# Patient Record
Sex: Female | Born: 1975 | Race: Black or African American | Hispanic: No | Marital: Single | Smoking: Never smoker
Health system: Southern US, Community
[De-identification: ages and names within clinical notes are randomized; demographics above are authoritative.]

## PROBLEM LIST (undated history)

## (undated) DIAGNOSIS — Z973 Presence of spectacles and contact lenses: Secondary | ICD-10-CM

## (undated) DIAGNOSIS — E669 Obesity, unspecified: Secondary | ICD-10-CM

## (undated) DIAGNOSIS — L309 Dermatitis, unspecified: Secondary | ICD-10-CM

## (undated) HISTORY — PX: ENDOMETRIAL ABLATION: SHX621

## (undated) HISTORY — PX: BREAST BIOPSY: SHX20

## (undated) HISTORY — PX: TUBAL LIGATION: SHX77

---

## 1998-04-09 DIAGNOSIS — A630 Anogenital (venereal) warts: Secondary | ICD-10-CM

## 1998-04-09 HISTORY — DX: Anogenital (venereal) warts: A63.0

## 2009-12-30 DIAGNOSIS — M2241 Chondromalacia patellae, right knee: Secondary | ICD-10-CM

## 2009-12-30 DIAGNOSIS — M94269 Chondromalacia, unspecified knee: Secondary | ICD-10-CM

## 2009-12-30 DIAGNOSIS — G5601 Carpal tunnel syndrome, right upper limb: Secondary | ICD-10-CM

## 2009-12-30 HISTORY — DX: Carpal tunnel syndrome, right upper limb: G56.01

## 2009-12-30 HISTORY — DX: Chondromalacia, unspecified knee: M94.269

## 2009-12-30 HISTORY — DX: Chondromalacia patellae, right knee: M22.41

## 2014-10-08 DIAGNOSIS — G43909 Migraine, unspecified, not intractable, without status migrainosus: Secondary | ICD-10-CM

## 2014-10-08 HISTORY — DX: Migraine, unspecified, not intractable, without status migrainosus: G43.909

## 2017-04-10 DIAGNOSIS — E7800 Pure hypercholesterolemia, unspecified: Secondary | ICD-10-CM

## 2017-04-10 HISTORY — DX: Pure hypercholesterolemia, unspecified: E78.00

## 2018-02-05 DIAGNOSIS — G609 Hereditary and idiopathic neuropathy, unspecified: Secondary | ICD-10-CM | POA: Diagnosis not present

## 2018-02-05 DIAGNOSIS — G43009 Migraine without aura, not intractable, without status migrainosus: Secondary | ICD-10-CM | POA: Diagnosis not present

## 2018-02-05 DIAGNOSIS — E7800 Pure hypercholesterolemia, unspecified: Secondary | ICD-10-CM | POA: Diagnosis not present

## 2018-02-05 DIAGNOSIS — R6889 Other general symptoms and signs: Secondary | ICD-10-CM | POA: Diagnosis not present

## 2018-02-05 DIAGNOSIS — R202 Paresthesia of skin: Secondary | ICD-10-CM

## 2018-02-05 HISTORY — DX: Paresthesia of skin: R20.2

## 2018-02-21 HISTORY — PX: BREAST BIOPSY: SHX20

## 2018-06-21 DIAGNOSIS — S29012A Strain of muscle and tendon of back wall of thorax, initial encounter: Secondary | ICD-10-CM | POA: Diagnosis not present

## 2018-07-01 DIAGNOSIS — H10022 Other mucopurulent conjunctivitis, left eye: Secondary | ICD-10-CM | POA: Diagnosis not present

## 2018-09-19 DIAGNOSIS — R309 Painful micturition, unspecified: Secondary | ICD-10-CM | POA: Diagnosis not present

## 2018-11-12 DIAGNOSIS — N390 Urinary tract infection, site not specified: Secondary | ICD-10-CM | POA: Diagnosis not present

## 2018-12-17 DIAGNOSIS — Z1231 Encounter for screening mammogram for malignant neoplasm of breast: Secondary | ICD-10-CM | POA: Diagnosis not present

## 2018-12-17 DIAGNOSIS — Z01419 Encounter for gynecological examination (general) (routine) without abnormal findings: Secondary | ICD-10-CM | POA: Diagnosis not present

## 2019-01-20 DIAGNOSIS — Z20828 Contact with and (suspected) exposure to other viral communicable diseases: Secondary | ICD-10-CM | POA: Diagnosis not present

## 2019-02-27 DIAGNOSIS — N760 Acute vaginitis: Secondary | ICD-10-CM | POA: Diagnosis not present

## 2019-02-27 DIAGNOSIS — B9689 Other specified bacterial agents as the cause of diseases classified elsewhere: Secondary | ICD-10-CM | POA: Diagnosis not present

## 2019-02-27 DIAGNOSIS — R102 Pelvic and perineal pain: Secondary | ICD-10-CM | POA: Diagnosis not present

## 2019-02-27 DIAGNOSIS — N730 Acute parametritis and pelvic cellulitis: Secondary | ICD-10-CM | POA: Diagnosis not present

## 2019-02-27 DIAGNOSIS — N739 Female pelvic inflammatory disease, unspecified: Secondary | ICD-10-CM | POA: Diagnosis not present

## 2019-02-27 DIAGNOSIS — N939 Abnormal uterine and vaginal bleeding, unspecified: Secondary | ICD-10-CM | POA: Diagnosis not present

## 2019-04-29 DIAGNOSIS — Z20828 Contact with and (suspected) exposure to other viral communicable diseases: Secondary | ICD-10-CM | POA: Diagnosis not present

## 2019-04-29 DIAGNOSIS — Z03818 Encounter for observation for suspected exposure to other biological agents ruled out: Secondary | ICD-10-CM | POA: Diagnosis not present

## 2019-06-02 DIAGNOSIS — R519 Headache, unspecified: Secondary | ICD-10-CM | POA: Diagnosis not present

## 2019-06-24 DIAGNOSIS — Z0000 Encounter for general adult medical examination without abnormal findings: Secondary | ICD-10-CM | POA: Diagnosis not present

## 2019-06-24 DIAGNOSIS — E7800 Pure hypercholesterolemia, unspecified: Secondary | ICD-10-CM | POA: Diagnosis not present

## 2019-06-24 DIAGNOSIS — E559 Vitamin D deficiency, unspecified: Secondary | ICD-10-CM | POA: Diagnosis not present

## 2019-06-24 DIAGNOSIS — E669 Obesity, unspecified: Secondary | ICD-10-CM | POA: Diagnosis not present

## 2019-06-24 DIAGNOSIS — G43009 Migraine without aura, not intractable, without status migrainosus: Secondary | ICD-10-CM | POA: Diagnosis not present

## 2019-08-07 DIAGNOSIS — Z03818 Encounter for observation for suspected exposure to other biological agents ruled out: Secondary | ICD-10-CM | POA: Diagnosis not present

## 2019-08-07 DIAGNOSIS — Z20822 Contact with and (suspected) exposure to covid-19: Secondary | ICD-10-CM | POA: Diagnosis not present

## 2019-10-11 DIAGNOSIS — Z03818 Encounter for observation for suspected exposure to other biological agents ruled out: Secondary | ICD-10-CM | POA: Diagnosis not present

## 2019-10-11 DIAGNOSIS — Z20822 Contact with and (suspected) exposure to covid-19: Secondary | ICD-10-CM | POA: Diagnosis not present

## 2019-11-01 DIAGNOSIS — R300 Dysuria: Secondary | ICD-10-CM | POA: Diagnosis not present

## 2019-11-01 DIAGNOSIS — R102 Pelvic and perineal pain: Secondary | ICD-10-CM | POA: Diagnosis not present

## 2019-11-15 DIAGNOSIS — R102 Pelvic and perineal pain: Secondary | ICD-10-CM | POA: Diagnosis not present

## 2019-11-15 DIAGNOSIS — R935 Abnormal findings on diagnostic imaging of other abdominal regions, including retroperitoneum: Secondary | ICD-10-CM | POA: Diagnosis not present

## 2019-11-15 DIAGNOSIS — R1031 Right lower quadrant pain: Secondary | ICD-10-CM | POA: Diagnosis not present

## 2019-11-15 DIAGNOSIS — N760 Acute vaginitis: Secondary | ICD-10-CM | POA: Diagnosis not present

## 2019-11-15 DIAGNOSIS — R110 Nausea: Secondary | ICD-10-CM | POA: Diagnosis not present

## 2019-11-15 DIAGNOSIS — B9689 Other specified bacterial agents as the cause of diseases classified elsewhere: Secondary | ICD-10-CM | POA: Diagnosis not present

## 2019-11-15 HISTORY — DX: Right lower quadrant pain: R10.31

## 2019-12-03 DIAGNOSIS — Z20822 Contact with and (suspected) exposure to covid-19: Secondary | ICD-10-CM | POA: Diagnosis not present

## 2019-12-03 DIAGNOSIS — Z03818 Encounter for observation for suspected exposure to other biological agents ruled out: Secondary | ICD-10-CM | POA: Diagnosis not present

## 2019-12-09 DIAGNOSIS — Z09 Encounter for follow-up examination after completed treatment for conditions other than malignant neoplasm: Secondary | ICD-10-CM | POA: Diagnosis not present

## 2019-12-09 DIAGNOSIS — R1030 Lower abdominal pain, unspecified: Secondary | ICD-10-CM | POA: Diagnosis not present

## 2019-12-09 DIAGNOSIS — Z23 Encounter for immunization: Secondary | ICD-10-CM | POA: Diagnosis not present

## 2020-01-02 DIAGNOSIS — R102 Pelvic and perineal pain: Secondary | ICD-10-CM | POA: Diagnosis not present

## 2020-01-02 DIAGNOSIS — N898 Other specified noninflammatory disorders of vagina: Secondary | ICD-10-CM | POA: Diagnosis not present

## 2020-01-02 DIAGNOSIS — Z01419 Encounter for gynecological examination (general) (routine) without abnormal findings: Secondary | ICD-10-CM | POA: Diagnosis not present

## 2020-02-04 DIAGNOSIS — R102 Pelvic and perineal pain: Secondary | ICD-10-CM | POA: Diagnosis not present

## 2020-02-04 DIAGNOSIS — D259 Leiomyoma of uterus, unspecified: Secondary | ICD-10-CM | POA: Diagnosis not present

## 2020-03-02 DIAGNOSIS — Z03818 Encounter for observation for suspected exposure to other biological agents ruled out: Secondary | ICD-10-CM | POA: Diagnosis not present

## 2020-03-02 DIAGNOSIS — Z20822 Contact with and (suspected) exposure to covid-19: Secondary | ICD-10-CM | POA: Diagnosis not present

## 2020-04-23 DIAGNOSIS — R350 Frequency of micturition: Secondary | ICD-10-CM | POA: Diagnosis not present

## 2020-04-23 DIAGNOSIS — R3915 Urgency of urination: Secondary | ICD-10-CM | POA: Diagnosis not present

## 2020-04-23 DIAGNOSIS — N898 Other specified noninflammatory disorders of vagina: Secondary | ICD-10-CM | POA: Diagnosis not present

## 2020-04-23 DIAGNOSIS — R300 Dysuria: Secondary | ICD-10-CM | POA: Diagnosis not present

## 2020-07-16 DIAGNOSIS — R102 Pelvic and perineal pain: Secondary | ICD-10-CM | POA: Diagnosis not present

## 2020-07-16 DIAGNOSIS — K5900 Constipation, unspecified: Secondary | ICD-10-CM | POA: Diagnosis not present

## 2020-07-16 DIAGNOSIS — D259 Leiomyoma of uterus, unspecified: Secondary | ICD-10-CM | POA: Diagnosis not present

## 2020-07-16 DIAGNOSIS — R110 Nausea: Secondary | ICD-10-CM | POA: Diagnosis not present

## 2020-12-05 DIAGNOSIS — R079 Chest pain, unspecified: Secondary | ICD-10-CM | POA: Diagnosis not present

## 2020-12-05 DIAGNOSIS — F17210 Nicotine dependence, cigarettes, uncomplicated: Secondary | ICD-10-CM | POA: Diagnosis not present

## 2020-12-05 DIAGNOSIS — R0789 Other chest pain: Secondary | ICD-10-CM | POA: Diagnosis not present

## 2020-12-05 DIAGNOSIS — R0602 Shortness of breath: Secondary | ICD-10-CM | POA: Diagnosis not present

## 2020-12-05 HISTORY — DX: Chest pain, unspecified: R07.9

## 2020-12-29 DIAGNOSIS — E669 Obesity, unspecified: Secondary | ICD-10-CM | POA: Diagnosis not present

## 2020-12-29 DIAGNOSIS — Z1159 Encounter for screening for other viral diseases: Secondary | ICD-10-CM | POA: Diagnosis not present

## 2020-12-29 DIAGNOSIS — Z6836 Body mass index (BMI) 36.0-36.9, adult: Secondary | ICD-10-CM | POA: Diagnosis not present

## 2020-12-29 DIAGNOSIS — G43009 Migraine without aura, not intractable, without status migrainosus: Secondary | ICD-10-CM | POA: Diagnosis not present

## 2020-12-29 DIAGNOSIS — Z0000 Encounter for general adult medical examination without abnormal findings: Secondary | ICD-10-CM | POA: Diagnosis not present

## 2020-12-29 DIAGNOSIS — Z23 Encounter for immunization: Secondary | ICD-10-CM | POA: Diagnosis not present

## 2021-02-16 DIAGNOSIS — D219 Benign neoplasm of connective and other soft tissue, unspecified: Secondary | ICD-10-CM | POA: Diagnosis not present

## 2021-02-16 DIAGNOSIS — N946 Dysmenorrhea, unspecified: Secondary | ICD-10-CM | POA: Diagnosis not present

## 2021-02-16 DIAGNOSIS — Z01419 Encounter for gynecological examination (general) (routine) without abnormal findings: Secondary | ICD-10-CM | POA: Diagnosis not present

## 2021-03-24 ENCOUNTER — Other Ambulatory Visit: Payer: Self-pay | Admitting: Obstetrics and Gynecology

## 2021-03-24 DIAGNOSIS — Z1231 Encounter for screening mammogram for malignant neoplasm of breast: Secondary | ICD-10-CM

## 2021-05-03 DIAGNOSIS — R809 Proteinuria, unspecified: Secondary | ICD-10-CM | POA: Diagnosis not present

## 2021-05-03 DIAGNOSIS — R1030 Lower abdominal pain, unspecified: Secondary | ICD-10-CM | POA: Diagnosis not present

## 2021-05-03 DIAGNOSIS — R5383 Other fatigue: Secondary | ICD-10-CM | POA: Diagnosis not present

## 2021-05-03 DIAGNOSIS — R3589 Other polyuria: Secondary | ICD-10-CM | POA: Diagnosis not present

## 2021-05-03 DIAGNOSIS — J069 Acute upper respiratory infection, unspecified: Secondary | ICD-10-CM | POA: Diagnosis not present

## 2021-05-08 DIAGNOSIS — R309 Painful micturition, unspecified: Secondary | ICD-10-CM | POA: Diagnosis not present

## 2021-05-13 DIAGNOSIS — E559 Vitamin D deficiency, unspecified: Secondary | ICD-10-CM

## 2021-05-13 HISTORY — DX: Vitamin D deficiency, unspecified: E55.9

## 2021-05-22 DIAGNOSIS — U071 COVID-19: Secondary | ICD-10-CM

## 2021-05-22 HISTORY — DX: COVID-19: U07.1

## 2021-06-14 ENCOUNTER — Ambulatory Visit: Payer: Self-pay

## 2021-06-14 DIAGNOSIS — D219 Benign neoplasm of connective and other soft tissue, unspecified: Secondary | ICD-10-CM | POA: Diagnosis not present

## 2021-06-14 DIAGNOSIS — N946 Dysmenorrhea, unspecified: Secondary | ICD-10-CM | POA: Diagnosis not present

## 2021-06-18 DIAGNOSIS — N390 Urinary tract infection, site not specified: Secondary | ICD-10-CM

## 2021-06-18 DIAGNOSIS — N898 Other specified noninflammatory disorders of vagina: Secondary | ICD-10-CM | POA: Diagnosis not present

## 2021-06-18 DIAGNOSIS — N3000 Acute cystitis without hematuria: Secondary | ICD-10-CM | POA: Diagnosis not present

## 2021-06-18 DIAGNOSIS — N760 Acute vaginitis: Secondary | ICD-10-CM | POA: Diagnosis not present

## 2021-06-18 DIAGNOSIS — R300 Dysuria: Secondary | ICD-10-CM | POA: Diagnosis not present

## 2021-06-18 DIAGNOSIS — B9689 Other specified bacterial agents as the cause of diseases classified elsewhere: Secondary | ICD-10-CM | POA: Diagnosis not present

## 2021-06-18 HISTORY — DX: Urinary tract infection, site not specified: N39.0

## 2021-06-21 DIAGNOSIS — E876 Hypokalemia: Secondary | ICD-10-CM | POA: Diagnosis not present

## 2021-06-21 DIAGNOSIS — Z79899 Other long term (current) drug therapy: Secondary | ICD-10-CM | POA: Diagnosis not present

## 2021-06-21 DIAGNOSIS — R110 Nausea: Secondary | ICD-10-CM | POA: Diagnosis not present

## 2021-06-21 DIAGNOSIS — R1011 Right upper quadrant pain: Secondary | ICD-10-CM | POA: Diagnosis not present

## 2021-06-21 DIAGNOSIS — D259 Leiomyoma of uterus, unspecified: Secondary | ICD-10-CM

## 2021-06-21 DIAGNOSIS — R109 Unspecified abdominal pain: Secondary | ICD-10-CM | POA: Diagnosis not present

## 2021-06-21 DIAGNOSIS — Z90710 Acquired absence of both cervix and uterus: Secondary | ICD-10-CM | POA: Diagnosis not present

## 2021-06-21 DIAGNOSIS — R1084 Generalized abdominal pain: Secondary | ICD-10-CM | POA: Diagnosis not present

## 2021-06-21 HISTORY — DX: Leiomyoma of uterus, unspecified: D25.9

## 2021-06-23 ENCOUNTER — Encounter (HOSPITAL_BASED_OUTPATIENT_CLINIC_OR_DEPARTMENT_OTHER): Payer: Self-pay | Admitting: Obstetrics and Gynecology

## 2021-06-23 ENCOUNTER — Other Ambulatory Visit: Payer: Self-pay

## 2021-06-23 DIAGNOSIS — Z01812 Encounter for preprocedural laboratory examination: Secondary | ICD-10-CM | POA: Diagnosis not present

## 2021-06-23 NOTE — Progress Notes (Signed)
? ? Your procedure is scheduled on Wednesday, 06/30/21 ? Report to [facility] ? Call this number if you have problems the morning of surgery  [phone]. ? ? [facility] [place].  WE ARE LOCATED IN THE [facility]. ? ?PLEASE BRING YOUR INSURANCE CARD AND PHOTO ID DAY OF SURGERY. ? ?ONLY 2 PEOPLE ARE ALLOWED IN  WAITING  ROOM.  ?                                   ? REMEMBER: ? DO NOT EAT FOOD, CANDY GUM OR MINTS  AFTER MIDNIGHT THE NIGHT BEFORE YOUR SURGERY . YOU MAY HAVE CLEAR LIQUIDS FROM MIDNIGHT THE NIGHT BEFORE YOUR SURGERY UNTIL  5:30 am. NO CLEAR LIQUIDS AFTER   5:30 am DAY OF SURGERY. ? ?YOU MAY  BRUSH YOUR TEETH MORNING OF SURGERY AND RINSE YOUR MOUTH OUT, NO CHEWING GUM CANDY OR MINTS. ? ? ? ? ?CLEAR LIQUID DIET ? ? ?Foods Allowed                                                                     Foods Excluded ? ?Coffee and tea, regular and decaf                             liquids that you cannot  ?Plain Jell-O                                                                   see through such as: ?Fruit ices (not with fruit pulp)                                     milk, soups, orange juice  ?Plain  Popsicles                                    All solid food ?Carbonated beverages, regular and diet                                    ?Cranberry, grape and apple juices ?Sports drinks like Gatorade ?_____________________________________________________________________ ?  ? ? TAKE THESE MEDICATIONS MORNING OF SURGERY:  Macrobid (only if you can tolerate without food), Zyrtec if needed, Flexeril if needed, Roxicodone if needed, Imitrex if needed ? ? UP TO 4 VISITORS  MAY VISIT IN THE EXTENDED RECOVERY ROOM UNTIL 800 PM ONLY.  1 VISITOR AGE 66 AND OVER MAY SPEND THE NIGHT AND MUST BE IN EXTENDED RECOVERY ROOM NO LATER THAN 800 PM . YOUR DISCHARGE TIME AFTER YOU SPEND THE NIGHT IS 900 AM THE MORNING AFTER  YOUR SURGERY. ? ?YOU MAY PACK A SMALL  OVERNIGHT BAG WITH TOILETRIES FOR YOUR OVERNIGHT STAY IF YOU WISH. ? ?YOUR PRESCRIPTION MEDICATIONS WILL BE PROVIDED DURING Spring. ? ? ?                                   ?DO NOT WEAR JEWERLY, MAKE UP. ?DO NOT WEAR LOTIONS, POWDERS, PERFUMES OR NAIL POLISH ON YOUR FINGERNAILS. TOENAIL POLISH IS OK TO WEAR. ?DO NOT SHAVE FOR 48 HOURS PRIOR TO DAY OF SURGERY. ?MEN MAY SHAVE FACE AND NECK. ?CONTACTS, GLASSES, OR DENTURES MAY NOT BE WORN TO SURGERY. ? ?REMEMBER: NO SMOKING, DRUGS OR ALCOHOL FOR 24 HOURS BEFORE YOUR SURGERY. ?                                   ?[facility] IS NOT RESPONSIBLE  FOR ANY BELONGINGS.                                  ?                                  . ?          [facility] - Preparing for Surgery ?Before surgery, you can play an important role.  Because skin is not sterile, your skin needs to be as free of germs as possible.  You can reduce the number of germs on your skin by washing with CHG (chlorahexidine gluconate) soap before surgery.  CHG is an antiseptic cleaner which kills germs and bonds with the skin to continue killing germs even after washing. ?Please DO NOT use if you have an allergy to CHG or antibacterial soaps.  If your skin becomes reddened/irritated stop using the CHG and inform your nurse when you arrive at Short Stay. ?Do not shave (including legs and underarms) for at least 48 hours prior to the first CHG shower.  You may shave your face/neck. ?Please follow these instructions carefully: ? 1.  Shower with CHG Soap the night before surgery and the  morning of Surgery. ? 2.  If you choose to wash your hair, wash your hair first as usual with your  normal  shampoo. ? 3.  After you shampoo, rinse your hair and body thoroughly to remove the  shampoo.                            ?4.  Use CHG as you would any other liquid soap.  You can apply chg directly  to the skin and wash , please wash your belly button thoroughly with chg soap provided night before and  morning of your surgery. ?                    Gently with a scrungie or clean washcloth. ? 5.  Apply the CHG Soap to your body ONLY FROM THE NECK DOWN.   Do not use on face/ open      ?                     Wound or open sores. Avoid contact with eyes, ears mouth  and genitals (private parts).  ?                     Production manager,  Genitals (private parts) with your normal soap. ?            6.  Wash thoroughly, paying special attention to the area where your surgery  will be performed. ? 7.  Thoroughly rinse your body with warm water from the neck down. ? 8.  DO NOT shower/wash with your normal soap after using and rinsing off  the CHG Soap. ?               9.  Pat yourself dry with a clean towel. ?           10.  Wear clean pajamas. ?           11.  Place clean sheets on your bed the night of your first shower and do not  sleep with pets. ?Day of Surgery : ?Do not apply any lotions/deodorants the morning of surgery.  Please wear clean clothes to the hospital/surgery center. ? ?IF YOU HAVE ANY SKIN IRRITATION OR PROBLEMS WITH THE SURGICAL SOAP, PLEASE GET A BAR OF GOLD DIAL SOAP AND SHOWER THE NIGHT BEFORE YOUR SURGERY AND THE MORNING OF YOUR SURGERY. PLEASE LET THE NURSE KNOW MORNING OF YOUR SURGERY IF YOU HAD ANY PROBLEMS WITH THE SURGICAL SOAP. ? ? ?________________________________________________________________________                  ?                                    ?  QUESTIONS CALL Camrin [facility] PRE OP NURSE PHONE [phone].                                    ?

## 2021-06-23 NOTE — Progress Notes (Signed)
Spoke w/ via phone for pre-op interview---Eriona ?Lab needs dos---- urine pregnancy POCT per anesthesia, surgeon orders pending as of 06/23/21              ?Lab results------06/28/21 lab appt for CBC, type & screen, 06/21/21 multiple labs including CBC in Care Everywhere ?COVID test -----patient states asymptomatic no test needed ?Arrive at -------0630 on Wednesday, 06/30/21 ?NPO after MN NO Solid Food.  Clear liquids from MN until---0530 ?Med rec completed ?Medications to take morning of surgery -----Zyrtec prn, Flexeril prn, Macrobid, Roxicodone prn, Imitrex prn ?Diabetic medication -----n/a ?Patient instructed no nail polish to be worn day of surgery ?Patient instructed to bring photo id and insurance card day of surgery ?Patient aware to have Driver (ride ) / caregiver    for 24 hours after surgery - daughter, Debby Freiberg ?Patient Special Instructions -----Extended recovery / overnight instructions given. ?Pre-Op special Istructions -----Requested orders via Epic IB from Dr. Landry Mellow on 06/23/21. ?Patient verbalized understanding of instructions that were given at this phone interview. ?Patient denies shortness of breath, chest pain, fever, cough at this phone interview.  ?

## 2021-06-28 ENCOUNTER — Encounter (HOSPITAL_COMMUNITY)
Admission: RE | Admit: 2021-06-28 | Discharge: 2021-06-28 | Disposition: A | Discharge status: Home / Self Care | Payer: BC Managed Care – PPO | Source: Ambulatory Visit | Attending: Obstetrics and Gynecology | Admitting: Obstetrics and Gynecology

## 2021-06-28 DIAGNOSIS — Z01812 Encounter for preprocedural laboratory examination: Secondary | ICD-10-CM | POA: Insufficient documentation

## 2021-06-28 DIAGNOSIS — Z01818 Encounter for other preprocedural examination: Secondary | ICD-10-CM

## 2021-06-28 LAB — CBC
HCT: 43.4 % (ref 36.0–46.0)
Hemoglobin: 14.5 g/dL (ref 12.0–15.0)
MCH: 29.7 pg (ref 26.0–34.0)
MCHC: 33.4 g/dL (ref 30.0–36.0)
MCV: 88.8 fL (ref 80.0–100.0)
Platelets: 245 10*3/uL (ref 150–400)
RBC: 4.89 MIL/uL (ref 3.87–5.11)
RDW: 12.5 % (ref 11.5–15.5)
WBC: 5.7 10*3/uL (ref 4.0–10.5)
nRBC: 0 % (ref 0.0–0.2)

## 2021-06-29 ENCOUNTER — Other Ambulatory Visit: Payer: Self-pay | Admitting: Obstetrics and Gynecology

## 2021-06-29 DIAGNOSIS — D251 Intramural leiomyoma of uterus: Secondary | ICD-10-CM

## 2021-06-29 NOTE — H&P (Signed)
? ?Reason for Appointment  ? ?1. Preop  ?  ?  ? ?History of Present Illness  ?General:   ?       46 y/o presents for preop visit. Pt is schedule for a robotic-assisted laparoscopic hysterectomy with bilateral salpingectomy on 06/30/2021 at 8:30 for the management for dysmenorrhea and fibroids. ?       U/S performed Feb 04, 2020, revealed uterus measuring 9.4 x 6.3 x 7.1cm. 2 fibroids were measured w/ the largest measuring 4.5 cm and located posterior. 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 Bilat OV WNL.  ?  ? ?Current Medications  ?Taking  ?Flexeril 10 mg 30 10 mg Tablets one tablet orally at bedtime as needed ?  ? ?Ibuprofen 800 MG Tablet 1 tablet Orally every 8 hrs as needed ?  ? ?Magnesium 300 MG Capsule 1 capsule with a meal Orally Once a day, Notes: prn ?  ? ?Vitamin D 50000 U Tablet as directed Orally  ?  ? ?Medication List reviewed and reconciled with the patient  ?  ?  ?  ? ?Past Medical History  ? ?     Medical History Verified. ?  ?  ?  ? ?Surgical History  ? ?      tubal ligation   ? ?      Novasure ablation   ?  ?  ? ?Family History  ? ?Father: deceased, lung cancer  ? ?Mother: alive, hyperlipidemia, diagnosed with Hypertension  ? ?denies any GYN family cancer hx.  ?  ?  ? ?Social History  ?General:   ?Tobacco use  cigarettes: Never smoked, Tobacco history last updated 06/14/2021, Vaping No. Alcohol: yes, social. Caffeine: yes, 1 serving daily. no Recreational drug use, marijuanna , in past. Marital Status: single. Children: girls, 2.   ?  ? ?Gyn History  ?Sexual activity not currently sexually active.   ?Periods : irregular.   ?LMP 12/2019.   ?Birth control BTL.   ?Last pap smear date 01/02/2020 - normal/HPV neg.   ?Last mammogram date 2020 per pt- normal.   ?H/O Abnormal pap smear yes, assessed with colposcopy, treated with LEEP.   ?H/O STD Ada.    ?  ? ?OB History  ?Number of pregnancies 3.   ?Pregnancy # 1 vaginal delivery.   ?Pregnancy # 2  vaginal delivery.   ?Pregnancy # 3 abortion.    ?  ? ?Allergies  ? ?Ultram: rash  ?  ?  ? ?Hospitalization/Major Diagnostic Procedure  ? ?chilbirth x 2   ?  ?  ? ?Review of Systems  ?CONSTITUTIONAL:   ?      Chills No. Fatigue No. Fever No. Night sweats No. Recent travel outside [place] No. Sweats No. Weight change No.     ?OPHTHALMOLOGY:   ?      Blurring of vision no. Change in vision no. Double vision no.     ?ENT:   ?      Dizziness no. Nose bleeds no. Sore throat no. Teeth pain no.     ?ALLERGY:   ?      Hives no.     ?CARDIOLOGY:   ?      Chest pain no. High blood pressure no. Irregular heart beat no. Leg edema no. Palpitations no.     ?RESPIRATORY:   ?      Shortness of breath no. Cough no. Wheezing no.     ?UROLOGY:   ?  Pain with urination no. Urinary urgency no. Urinary frequency no. Urinary incontinence no. Difficulty urinating No. Blood in urine No.     ?GASTROENTEROLOGY:   ?      Abdominal pain no. Appetite change no. Bloating/belching no. Blood in stool or on toilet paper no. Change in bowel movements no. Constipation no. Diarrhea no. Difficulty swallowing no. Nausea no.     ?FEMALE REPRODUCTIVE:   ?      Vulvar pain no. Vulvar rash no. Abnormal vaginal bleeding no. Breast pain no. Nipple discharge no. Pain with intercourse no. Pelvic pain yes. Unusual vaginal discharge no. Vaginal itching no.     ?MUSCULOSKELETAL:   ?      Muscle aches no.     ?NEUROLOGY:   ?      Headache no. Tingling/numbness no. Weakness no.     ?PSYCHOLOGY:   ?      Depression no. Anxiety no. Nervousness no. Sleep disturbances no. Suicidal ideation no .     ?ENDOCRINOLOGY:   ?      Excessive thirst no. Excessive urination no. Hair loss no. Heat or cold intolerance no.     ?HEMATOLOGY/LYMPH:   ?      Abnormal bleeding no. Easy bruising no. Swollen glands no.     ?DERMATOLOGY:   ?      New/changing skin lesion no. Rash no. Sores no.      ?  ? ? ?Vital Signs  ?Wt 203.2, Wt change -15.6 lbs, Ht 65, BMI 33.81, Pulse sitting 94, BP  sitting 133/90.  ?  ? ?Examination  ?General Examination: ?      CONSTITUTIONAL: alert, oriented, NAD . SKIN:  moist, warm. EYES:  Conjunctiva clear. LUNGS:  good I:E efffort noted, clear to auscultation bilaterally. HEART:  regular rate and rhythm. ABDOMEN: soft, non-tender/non-distended, bowel sounds present . FEMALE GENITOURINARY: normal external genitalia, labia - unremarkable, vagina - pink moist mucosa, no lesions or abnormal discharge, cervix - no discharge or lesions or CMT, adnexa - no masses or tenderness, uterus - nontender and normal size on palpation . EXTREMITIES:  no edema present. PSYCH:  affect normal, good eye contact.  ?  ?  ? ?Physical Examination  ?Chaperone present:   ?      Chaperone present  Beather Arbour 06/14/2021 12:26:42 PM > , for pelvic exam.   ?      ?  ? ?Assessments  ?  ? ?1. Fibroids - D21.9 (Primary)  ? ?2. Dysmenorrhea - N94.6  ?  ? ?Treatment  ? ?1. Fibroids   ?Notes: patient desires definitive therapy via a hysterectomy.. planning robotic assisted laparoscopic hysterectomy with bilateral salpingectomy. . I discussed with the pt r/b/a of hysterectomy including but not limited to infection/ bleeding damage to bowel bladder ureters and surrounding organs with the need for further surgery. r/o conversion for laparoscopic hysterectomy to total abdominal hysterectomy discussed. ... r/o transfustion hiv/ hep B&C discussed.. pt voiced understanding and desires to proceed. ? ?she is advised not to eat after midnight the night prior to surgery. she should avoid NSAIDS between now and surgery. .     ? ?2. Dysmenorrhea   ?Notes: patient desires definitive therapy via a hysterectomy.. planning robotic assisted laparoscopic hysterectomy with bilateral salpingectomy. . I discussed with the pt r/b/a of hysterectomy including but not limited to infection/ bleeding damage to bowel bladder ureters and surrounding organs with the need for further surgery. r/o conversion for laparoscopic hysterectomy  to total abdominal hysterectomy discussed. Marland Kitchen..  r/o transfustion hiv/ hep B&C discussed.. pt voiced understanding and desires to proceed..     ?  ?  ? ?

## 2021-06-29 NOTE — Anesthesia Preprocedure Evaluation (Addendum)
Anesthesia Evaluation  ?Patient identified by MRN, date of birth, ID band ?Patient awake ? ? ? ?Reviewed: ?Allergy & Precautions, NPO status , Patient's Chart, lab work & pertinent test results ? ?History of Anesthesia Complications ?Negative for: history of anesthetic complications ? ?Airway ?Mallampati: II ? ?TM Distance: >3 FB ?Neck ROM: Full ? ? ? Dental ?no notable dental hx. ?(+) Dental Advisory Given ?  ?Pulmonary ?neg pulmonary ROS,  ?  ?Pulmonary exam normal ? ? ? ? ? ? ? Cardiovascular ?negative cardio ROS ?Normal cardiovascular exam ? ? ?  ?Neuro/Psych ? Headaches,   ? GI/Hepatic ?negative GI ROS, Neg liver ROS,   ?Endo/Other  ?negative endocrine ROS ? Renal/GU ?negative Renal ROS  ? ?  ?Musculoskeletal ?negative musculoskeletal ROS ?(+)  ? Abdominal ?  ?Peds ? Hematology ?negative hematology ROS ?(+)   ?Anesthesia Other Findings ? ? Reproductive/Obstetrics ? ?  ? ? ? ? ? ? ? ? ? ? ? ? ? ?  ?  ? ? ? ? ? ? ? ?Anesthesia Physical ?Anesthesia Plan ? ?ASA: 2 ? ?Anesthesia Plan: General  ? ?Post-op Pain Management: Celebrex PO (pre-op)* and Tylenol PO (pre-op)*  ? ?Induction:  ? ?PONV Risk Score and Plan: 4 or greater and Ondansetron, Dexamethasone, Midazolam and Scopolamine patch - Pre-op ? ?Airway Management Planned: Oral ETT ? ?Additional Equipment:  ? ?Intra-op Plan:  ? ?Post-operative Plan: Extubation in OR ? ?Informed Consent: I have reviewed the patients History and Physical, chart, labs and discussed the procedure including the risks, benefits and alternatives for the proposed anesthesia with the patient or authorized representative who has indicated his/her understanding and acceptance.  ? ? ? ?Dental advisory given ? ?Plan Discussed with: Anesthesiologist and CRNA ? ?Anesthesia Plan Comments:   ? ? ? ? ? ?Anesthesia Quick Evaluation ? ?

## 2021-06-30 ENCOUNTER — Observation Stay (HOSPITAL_BASED_OUTPATIENT_CLINIC_OR_DEPARTMENT_OTHER): Payer: BC Managed Care – PPO | Admitting: Anesthesiology

## 2021-06-30 ENCOUNTER — Observation Stay (HOSPITAL_BASED_OUTPATIENT_CLINIC_OR_DEPARTMENT_OTHER)
Admission: RE | Admit: 2021-06-30 | Discharge: 2021-06-30 | Disposition: A | Discharge status: Home / Self Care | Payer: BC Managed Care – PPO | Attending: Obstetrics and Gynecology | Admitting: Obstetrics and Gynecology

## 2021-06-30 ENCOUNTER — Other Ambulatory Visit: Payer: Self-pay

## 2021-06-30 ENCOUNTER — Encounter (HOSPITAL_BASED_OUTPATIENT_CLINIC_OR_DEPARTMENT_OTHER)
Admission: RE | Disposition: A | Discharge status: Home / Self Care | Payer: Self-pay | Source: Home / Self Care | Attending: Obstetrics and Gynecology

## 2021-06-30 ENCOUNTER — Encounter (HOSPITAL_BASED_OUTPATIENT_CLINIC_OR_DEPARTMENT_OTHER): Payer: Self-pay | Admitting: Obstetrics and Gynecology

## 2021-06-30 DIAGNOSIS — D251 Intramural leiomyoma of uterus: Principal | ICD-10-CM | POA: Insufficient documentation

## 2021-06-30 DIAGNOSIS — D282 Benign neoplasm of uterine tubes and ligaments: Secondary | ICD-10-CM | POA: Diagnosis not present

## 2021-06-30 DIAGNOSIS — D259 Leiomyoma of uterus, unspecified: Secondary | ICD-10-CM | POA: Diagnosis not present

## 2021-06-30 DIAGNOSIS — Z90710 Acquired absence of both cervix and uterus: Secondary | ICD-10-CM | POA: Diagnosis present

## 2021-06-30 DIAGNOSIS — N8003 Adenomyosis of the uterus: Secondary | ICD-10-CM | POA: Insufficient documentation

## 2021-06-30 DIAGNOSIS — N9089 Other specified noninflammatory disorders of vulva and perineum: Secondary | ICD-10-CM | POA: Diagnosis not present

## 2021-06-30 DIAGNOSIS — L814 Other melanin hyperpigmentation: Secondary | ICD-10-CM | POA: Diagnosis not present

## 2021-06-30 DIAGNOSIS — N946 Dysmenorrhea, unspecified: Secondary | ICD-10-CM | POA: Diagnosis not present

## 2021-06-30 DIAGNOSIS — Z01818 Encounter for other preprocedural examination: Secondary | ICD-10-CM

## 2021-06-30 HISTORY — DX: Obesity, unspecified: E66.9

## 2021-06-30 HISTORY — DX: Dermatitis, unspecified: L30.9

## 2021-06-30 HISTORY — PX: ROBOTIC ASSISTED LAPAROSCOPIC HYSTERECTOMY AND SALPINGECTOMY: SHX6379

## 2021-06-30 HISTORY — DX: Presence of spectacles and contact lenses: Z97.3

## 2021-06-30 LAB — POCT PREGNANCY, URINE: Preg Test, Ur: NEGATIVE

## 2021-06-30 LAB — TYPE AND SCREEN
ABO/RH(D): O POS
Antibody Screen: NEGATIVE

## 2021-06-30 LAB — ABO/RH: ABO/RH(D): O POS

## 2021-06-30 SURGERY — XI ROBOTIC ASSISTED LAPAROSCOPIC HYSTERECTOMY AND SALPINGECTOMY
Anesthesia: General | Laterality: Bilateral

## 2021-06-30 MED ORDER — AMISULPRIDE (ANTIEMETIC) 5 MG/2ML IV SOLN: 10.0000 mg | Freq: Once | INTRAVENOUS | Status: DC | PRN

## 2021-06-30 MED ORDER — ONDANSETRON HCL 4 MG/2ML IJ SOLN
INTRAMUSCULAR | Status: DC | PRN
  Administered 2021-06-30: 4 mg via INTRAVENOUS

## 2021-06-30 MED ORDER — EPHEDRINE SULFATE (PRESSORS) 50 MG/ML IJ SOLN
INTRAMUSCULAR | Status: DC | PRN
  Administered 2021-06-30: 10 mg via INTRAVENOUS

## 2021-06-30 MED ORDER — SCOPOLAMINE 1 MG/3DAYS TD PT72
1.0000 | MEDICATED_PATCH | TRANSDERMAL | Status: DC
  Administered 2021-06-30: 1.5 mg via TRANSDERMAL

## 2021-06-30 MED ORDER — SCOPOLAMINE 1 MG/3DAYS TD PT72
MEDICATED_PATCH | TRANSDERMAL | Status: AC
  Filled 2021-06-30: qty 1

## 2021-06-30 MED ORDER — SODIUM CHLORIDE 0.9 % IV SOLN
2.0000 g | INTRAVENOUS | Status: AC
  Administered 2021-06-30: 2 g via INTRAVENOUS

## 2021-06-30 MED ORDER — SUGAMMADEX SODIUM 200 MG/2ML IV SOLN
INTRAVENOUS | Status: DC | PRN
  Administered 2021-06-30: 200 mg via INTRAVENOUS

## 2021-06-30 MED ORDER — OXYCODONE HCL 5 MG PO TABS
ORAL_TABLET | ORAL | Status: AC
  Filled 2021-06-30: qty 1

## 2021-06-30 MED ORDER — MENTHOL 3 MG MT LOZG: 1.0000 | LOZENGE | OROMUCOSAL | Status: DC | PRN

## 2021-06-30 MED ORDER — FENTANYL CITRATE (PF) 100 MCG/2ML IJ SOLN
INTRAMUSCULAR | Status: DC | PRN
  Administered 2021-06-30: 100 ug via INTRAVENOUS
  Administered 2021-06-30: 50 ug via INTRAVENOUS

## 2021-06-30 MED ORDER — POVIDONE-IODINE 10 % EX SWAB: 2.0000 "application " | Freq: Once | CUTANEOUS | Status: DC

## 2021-06-30 MED ORDER — ACETAMINOPHEN 500 MG PO TABS
1000.0000 mg | ORAL_TABLET | Freq: Four times a day (QID) | ORAL | Status: DC
  Administered 2021-06-30: 1000 mg via ORAL

## 2021-06-30 MED ORDER — IBUPROFEN 600 MG PO TABS: 600.0000 mg | ORAL_TABLET | Freq: Four times a day (QID) | ORAL | 1 refills | Status: AC | PRN

## 2021-06-30 MED ORDER — LACTATED RINGERS IV SOLN: INTRAVENOUS | Status: DC

## 2021-06-30 MED ORDER — IBUPROFEN 200 MG PO TABS: 600.0000 mg | ORAL_TABLET | Freq: Four times a day (QID) | ORAL | Status: DC

## 2021-06-30 MED ORDER — SIMETHICONE 80 MG PO CHEW: 80.0000 mg | CHEWABLE_TABLET | Freq: Four times a day (QID) | ORAL | Status: DC | PRN

## 2021-06-30 MED ORDER — DEXAMETHASONE SODIUM PHOSPHATE 4 MG/ML IJ SOLN
INTRAMUSCULAR | Status: DC | PRN
  Administered 2021-06-30: 8 mg via INTRAVENOUS

## 2021-06-30 MED ORDER — GABAPENTIN 300 MG PO CAPS
300.0000 mg | ORAL_CAPSULE | ORAL | Status: AC
  Administered 2021-06-30: 300 mg via ORAL

## 2021-06-30 MED ORDER — ROCURONIUM BROMIDE 100 MG/10ML IV SOLN
INTRAVENOUS | Status: DC | PRN
  Administered 2021-06-30: 100 mg via INTRAVENOUS

## 2021-06-30 MED ORDER — PHENYLEPHRINE HCL (PRESSORS) 10 MG/ML IV SOLN
INTRAVENOUS | Status: AC
  Filled 2021-06-30: qty 1

## 2021-06-30 MED ORDER — SENNA 8.6 MG PO TABS
ORAL_TABLET | ORAL | Status: AC
  Filled 2021-06-30: qty 1

## 2021-06-30 MED ORDER — SODIUM CHLORIDE 0.9 % IV SOLN
INTRAVENOUS | Status: DC | PRN
  Administered 2021-06-30: 120 mL

## 2021-06-30 MED ORDER — ACETAMINOPHEN 500 MG PO TABS: 1000.0000 mg | ORAL_TABLET | Freq: Three times a day (TID) | ORAL | 0 refills | Status: AC | PRN

## 2021-06-30 MED ORDER — OXYCODONE HCL 5 MG PO TABS: 5.0000 mg | ORAL_TABLET | ORAL | 0 refills | Status: AC | PRN

## 2021-06-30 MED ORDER — KETOROLAC TROMETHAMINE 30 MG/ML IJ SOLN
30.0000 mg | Freq: Once | INTRAMUSCULAR | Status: AC
  Administered 2021-06-30: 30 mg via INTRAVENOUS

## 2021-06-30 MED ORDER — ONDANSETRON HCL 4 MG PO TABS: 4.0000 mg | ORAL_TABLET | Freq: Four times a day (QID) | ORAL | Status: DC | PRN

## 2021-06-30 MED ORDER — OXYCODONE HCL 5 MG PO TABS
ORAL_TABLET | ORAL | Status: AC
  Filled 2021-06-30: qty 2

## 2021-06-30 MED ORDER — ONDANSETRON HCL 4 MG/2ML IJ SOLN: 4.0000 mg | Freq: Four times a day (QID) | INTRAMUSCULAR | Status: DC | PRN

## 2021-06-30 MED ORDER — FENTANYL CITRATE (PF) 100 MCG/2ML IJ SOLN
25.0000 ug | INTRAMUSCULAR | Status: DC | PRN
  Administered 2021-06-30 (×2): 50 ug via INTRAVENOUS

## 2021-06-30 MED ORDER — STERILE WATER FOR IRRIGATION IR SOLN
Status: DC | PRN
  Administered 2021-06-30: 500 mL

## 2021-06-30 MED ORDER — GLYCOPYRROLATE 0.2 MG/ML IJ SOLN
INTRAMUSCULAR | Status: DC | PRN
  Administered 2021-06-30: .1 mg via INTRAVENOUS

## 2021-06-30 MED ORDER — PROPOFOL 10 MG/ML IV BOLUS
INTRAVENOUS | Status: DC | PRN
  Administered 2021-06-30: 200 mg via INTRAVENOUS
  Administered 2021-06-30: 20 mg via INTRAVENOUS

## 2021-06-30 MED ORDER — FENTANYL CITRATE (PF) 100 MCG/2ML IJ SOLN
INTRAMUSCULAR | Status: AC
  Filled 2021-06-30: qty 2

## 2021-06-30 MED ORDER — PANTOPRAZOLE SODIUM 40 MG PO TBEC
DELAYED_RELEASE_TABLET | ORAL | Status: AC
  Filled 2021-06-30: qty 1

## 2021-06-30 MED ORDER — CELECOXIB 200 MG PO CAPS
ORAL_CAPSULE | ORAL | Status: AC
Start: 2021-06-30 — End: ?
  Filled 2021-06-30: qty 1

## 2021-06-30 MED ORDER — SENNA 8.6 MG PO TABS
1.0000 | ORAL_TABLET | Freq: Two times a day (BID) | ORAL | Status: DC
  Administered 2021-06-30: 8.6 mg via ORAL

## 2021-06-30 MED ORDER — OXYCODONE HCL 5 MG PO TABS
5.0000 mg | ORAL_TABLET | ORAL | Status: DC | PRN
  Administered 2021-06-30: 10 mg via ORAL
  Administered 2021-06-30: 5 mg via ORAL

## 2021-06-30 MED ORDER — SODIUM CHLORIDE 0.9 % IR SOLN
Status: DC | PRN
Start: 2021-06-30 — End: 2021-06-30
  Administered 2021-06-30: 1000 mL

## 2021-06-30 MED ORDER — MIDAZOLAM HCL 2 MG/2ML IJ SOLN
INTRAMUSCULAR | Status: DC | PRN
  Administered 2021-06-30: 2 mg via INTRAVENOUS

## 2021-06-30 MED ORDER — ACETAMINOPHEN 500 MG PO TABS
1000.0000 mg | ORAL_TABLET | ORAL | Status: AC
  Administered 2021-06-30: 1000 mg via ORAL

## 2021-06-30 MED ORDER — ALUM & MAG HYDROXIDE-SIMETH 200-200-20 MG/5ML PO SUSP: 30.0000 mL | ORAL | Status: DC | PRN

## 2021-06-30 MED ORDER — GABAPENTIN 300 MG PO CAPS
ORAL_CAPSULE | ORAL | Status: AC
  Filled 2021-06-30: qty 1

## 2021-06-30 MED ORDER — PANTOPRAZOLE SODIUM 40 MG PO TBEC
40.0000 mg | DELAYED_RELEASE_TABLET | Freq: Every day | ORAL | Status: DC
  Administered 2021-06-30: 40 mg via ORAL

## 2021-06-30 MED ORDER — HYDROMORPHONE HCL 1 MG/ML IJ SOLN: 0.2000 mg | INTRAMUSCULAR | Status: DC | PRN

## 2021-06-30 MED ORDER — PHENYLEPHRINE HCL-NACL 20-0.9 MG/250ML-% IV SOLN
INTRAVENOUS | Status: DC | PRN
  Administered 2021-06-30: 30 ug/min via INTRAVENOUS

## 2021-06-30 MED ORDER — FENTANYL CITRATE (PF) 250 MCG/5ML IJ SOLN
INTRAMUSCULAR | Status: AC
  Filled 2021-06-30: qty 5

## 2021-06-30 MED ORDER — ACETAMINOPHEN 500 MG PO TABS
ORAL_TABLET | ORAL | Status: AC
  Filled 2021-06-30: qty 2

## 2021-06-30 MED ORDER — PHENYLEPHRINE HCL (PRESSORS) 10 MG/ML IV SOLN
INTRAVENOUS | Status: DC | PRN
  Administered 2021-06-30: 80 ug via INTRAVENOUS
  Administered 2021-06-30: 160 ug via INTRAVENOUS

## 2021-06-30 MED ORDER — CELECOXIB 200 MG PO CAPS
200.0000 mg | ORAL_CAPSULE | Freq: Once | ORAL | Status: AC
Start: 2021-06-30 — End: 2021-06-30
  Administered 2021-06-30: 200 mg via ORAL

## 2021-06-30 MED ORDER — ACETAMINOPHEN 500 MG PO TABS: 1000.0000 mg | ORAL_TABLET | Freq: Once | ORAL | Status: DC

## 2021-06-30 MED ORDER — LIDOCAINE HCL (CARDIAC) PF 100 MG/5ML IV SOSY
PREFILLED_SYRINGE | INTRAVENOUS | Status: DC | PRN
  Administered 2021-06-30: 50 mg via INTRAVENOUS

## 2021-06-30 MED ORDER — PHENYLEPHRINE HCL (PRESSORS) 10 MG/ML IV SOLN
INTRAVENOUS | Status: DC | PRN
  Administered 2021-06-30: 80 ug via INTRAVENOUS
  Administered 2021-06-30: 160 ug via INTRAVENOUS
  Administered 2021-06-30 (×2): 80 ug via INTRAVENOUS

## 2021-06-30 MED ORDER — GLYCOPYRROLATE 0.2 MG/ML IJ SOLN
INTRAMUSCULAR | Status: DC | PRN
  Administered 2021-06-30 (×2): .1 mg via INTRAVENOUS

## 2021-06-30 MED ORDER — MIDAZOLAM HCL 2 MG/2ML IJ SOLN
INTRAMUSCULAR | Status: AC
Start: 2021-06-30 — End: ?
  Filled 2021-06-30: qty 2

## 2021-06-30 MED ORDER — ZOLPIDEM TARTRATE 5 MG PO TABS: 5.0000 mg | ORAL_TABLET | Freq: Every evening | ORAL | Status: DC | PRN

## 2021-06-30 MED ORDER — PROMETHAZINE HCL 25 MG/ML IJ SOLN: 6.2500 mg | INTRAMUSCULAR | Status: DC | PRN

## 2021-06-30 MED ORDER — SODIUM CHLORIDE 0.9 % IV SOLN
INTRAVENOUS | Status: AC
  Filled 2021-06-30: qty 2

## 2021-06-30 MED ORDER — KETOROLAC TROMETHAMINE 30 MG/ML IJ SOLN
INTRAMUSCULAR | Status: AC
  Filled 2021-06-30: qty 1

## 2021-06-30 SURGICAL SUPPLY — 74 items
ADH SKN CLS APL DERMABOND .7 (GAUZE/BANDAGES/DRESSINGS) ×1
APL SRG 38 LTWT LNG FL B (MISCELLANEOUS) ×1
APPLICATOR ARISTA FLEXITIP XL (MISCELLANEOUS) ×1 IMPLANT
BARRIER ADHS 3X4 INTERCEED (GAUZE/BANDAGES/DRESSINGS) IMPLANT
BRR ADH 4X3 ABS CNTRL BYND (GAUZE/BANDAGES/DRESSINGS)
CATH FOLEY 3WAY  5CC 16FR (CATHETERS) ×1
CATH FOLEY 3WAY 5CC 16FR (CATHETERS) ×1 IMPLANT
CELLS DAT CNTRL 66122 CELL SVR (MISCELLANEOUS) IMPLANT
COVER BACK TABLE 60X90IN (DRAPES) ×2 IMPLANT
COVER TIP SHEARS 8 DVNC (MISCELLANEOUS) ×1 IMPLANT
COVER TIP SHEARS 8MM DA VINCI (MISCELLANEOUS) ×1
DECANTER SPIKE VIAL GLASS SM (MISCELLANEOUS) ×4 IMPLANT
DEFOGGER SCOPE WARMER CLEARIFY (MISCELLANEOUS) ×2 IMPLANT
DERMABOND ADVANCED (GAUZE/BANDAGES/DRESSINGS) ×1
DERMABOND ADVANCED .7 DNX12 (GAUZE/BANDAGES/DRESSINGS) ×1 IMPLANT
DILATOR CANAL MILEX (MISCELLANEOUS) ×2 IMPLANT
DRAPE ARM DVNC X/XI (DISPOSABLE) ×4 IMPLANT
DRAPE COLUMN DVNC XI (DISPOSABLE) ×1 IMPLANT
DRAPE DA VINCI XI ARM (DISPOSABLE) ×4
DRAPE DA VINCI XI COLUMN (DISPOSABLE) ×1
DRAPE UTILITY 15X26 TOWEL STRL (DRAPES) ×2 IMPLANT
DURAPREP 26ML APPLICATOR (WOUND CARE) ×2 IMPLANT
ELECT REM PT RETURN 9FT ADLT (ELECTROSURGICAL) ×2
ELECTRODE REM PT RTRN 9FT ADLT (ELECTROSURGICAL) ×1 IMPLANT
GAUZE 4X4 16PLY ~~LOC~~+RFID DBL (SPONGE) ×4 IMPLANT
GLOVE BIOGEL M 7.0 STRL (GLOVE) ×6 IMPLANT
GLOVE BIOGEL PI IND STRL 7.0 (GLOVE) ×6 IMPLANT
GLOVE BIOGEL PI INDICATOR 7.0 (GLOVE) ×6
GLOVE SURG ENC TEXT LTX SZ6.5 (GLOVE) ×6 IMPLANT
GLOVE SURG UNDER POLY LF SZ6.5 (GLOVE) ×12 IMPLANT
HEMOSTAT ARISTA ABSORB 3G PWDR (HEMOSTASIS) ×1 IMPLANT
HOLDER FOLEY CATH W/STRAP (MISCELLANEOUS) IMPLANT
IRRIG SUCT STRYKERFLOW 2 WTIP (MISCELLANEOUS) ×2
IRRIGATION SUCT STRKRFLW 2 WTP (MISCELLANEOUS) ×1 IMPLANT
KIT TURNOVER CYSTO (KITS) ×2 IMPLANT
LEGGING LITHOTOMY PAIR STRL (DRAPES) ×2 IMPLANT
MANIFOLD NEPTUNE II (INSTRUMENTS) ×2 IMPLANT
OBTURATOR OPTICAL STANDARD 8MM (TROCAR) ×1
OBTURATOR OPTICAL STND 8 DVNC (TROCAR) ×1
OBTURATOR OPTICALSTD 8 DVNC (TROCAR) ×1 IMPLANT
OCCLUDER COLPOPNEUMO (BALLOONS) ×2 IMPLANT
PACK ROBOT WH (CUSTOM PROCEDURE TRAY) ×2 IMPLANT
PACK ROBOTIC GOWN (GOWN DISPOSABLE) ×2 IMPLANT
PACK TRENDGUARD 450 HYBRID PRO (MISCELLANEOUS) IMPLANT
PAD OB MATERNITY 4.3X12.25 (PERSONAL CARE ITEMS) ×2 IMPLANT
PAD PREP 24X48 CUFFED NSTRL (MISCELLANEOUS) ×2 IMPLANT
PROTECTOR NERVE ULNAR (MISCELLANEOUS) ×2 IMPLANT
RETRACTOR WND ALEXIS 18 MED (MISCELLANEOUS) IMPLANT
RTRCTR WOUND ALEXIS 18CM MED (MISCELLANEOUS)
RTRCTR WOUND ALEXIS 18CM SML (INSTRUMENTS)
SAVER CELL AAL HAEMONETICS (INSTRUMENTS) IMPLANT
SCISSORS LAP 5X45 EPIX DISP (ENDOMECHANICALS) IMPLANT
SEAL CANN UNIV 5-8 DVNC XI (MISCELLANEOUS) ×4 IMPLANT
SEAL XI 5MM-8MM UNIVERSAL (MISCELLANEOUS) ×4
SEALER VESSEL DA VINCI XI (MISCELLANEOUS)
SEALER VESSEL EXT DVNC XI (MISCELLANEOUS) IMPLANT
SET IRRIG Y TYPE TUR BLADDER L (SET/KITS/TRAYS/PACK) IMPLANT
SET TRI-LUMEN FLTR TB AIRSEAL (TUBING) ×2 IMPLANT
SPONGE T-LAP 4X18 ~~LOC~~+RFID (SPONGE) ×2 IMPLANT
SUT VIC AB 0 CT1 27 (SUTURE) ×4
SUT VIC AB 0 CT1 27XBRD ANBCTR (SUTURE) ×2 IMPLANT
SUT VICRYL 0 UR6 27IN ABS (SUTURE) IMPLANT
SUT VICRYL RAPIDE 4/0 PS 2 (SUTURE) ×6 IMPLANT
SUT VLOC 180 0 9IN  GS21 (SUTURE) ×1
SUT VLOC 180 0 9IN GS21 (SUTURE) ×1 IMPLANT
TIP RUMI ORANGE 6.7MMX12CM (TIP) IMPLANT
TIP UTERINE 5.1X6CM LAV DISP (MISCELLANEOUS) ×1 IMPLANT
TIP UTERINE 6.7X10CM GRN DISP (MISCELLANEOUS) IMPLANT
TIP UTERINE 6.7X6CM WHT DISP (MISCELLANEOUS) IMPLANT
TIP UTERINE 6.7X8CM BLUE DISP (MISCELLANEOUS) IMPLANT
TOWEL OR 17X26 10 PK STRL BLUE (TOWEL DISPOSABLE) ×2 IMPLANT
TRENDGUARD 450 HYBRID PRO PACK (MISCELLANEOUS)
TROCAR PORT AIRSEAL 8X120 (TROCAR) ×2 IMPLANT
WATER STERILE IRR 1000ML POUR (IV SOLUTION) ×2 IMPLANT

## 2021-06-30 NOTE — Transfer of Care (Signed)
Immediate Anesthesia Transfer of Care Note ? ?Patient: Renee Mccormick ? ?Procedure(s) Performed: XI ROBOTIC ASSISTED LAPAROSCOPIC HYSTERECTOMY AND BILATERAL SALPINGECTOMY, VAGINAL BIOPSY (Bilateral) ? ?Patient Location: PACU ? ?Anesthesia Type:General ? ?Level of Consciousness: awake and patient cooperative ? ?Airway & Oxygen Therapy: Patient Spontanous Breathing and Patient connected to nasal cannula oxygen ? ?Post-op Assessment: Report given to RN and Post -op Vital signs reviewed and stable ? ?Post vital signs: Reviewed and stable ? ?Last Vitals:  ?Vitals Value Taken Time  ?BP 146/90 06/30/21 1117  ?Temp    ?Pulse 101 06/30/21 1118  ?Resp 20 06/30/21 1118  ?SpO2 99 % 06/30/21 1118  ?Vitals shown include unvalidated device data. ? ?Last Pain:  ?Vitals:  ? 06/30/21 0704  ?TempSrc: Oral  ?PainSc: 3   ?   ? ?Patients Stated Pain Goal: 3 (06/30/21 0704) ? ?Complications: No notable events documented. ?

## 2021-06-30 NOTE — Anesthesia Procedure Notes (Signed)
Procedure Name: Intubation ?Date/Time: 06/30/2021 8:44 AM ?Performed by: Georgeanne Nim, CRNA ?Pre-anesthesia Checklist: Patient identified, Emergency Drugs available, Suction available, Patient being monitored and Timeout performed ?Patient Re-evaluated:Patient Re-evaluated prior to induction ?Oxygen Delivery Method: Circle system utilized ?Preoxygenation: Pre-oxygenation with 100% oxygen ?Induction Type: IV induction ?Ventilation: Mask ventilation without difficulty ?Laryngoscope Size: Mac and 4 ?Grade View: Grade I ?Tube type: Oral ?Tube size: 7.0 mm ?Number of attempts: 1 ?Airway Equipment and Method: Stylet ?Placement Confirmation: ETT inserted through vocal cords under direct vision, positive ETCO2, CO2 detector and breath sounds checked- equal and bilateral ?Secured at: 22 cm ?Tube secured with: Tape ?Dental Injury: Teeth and Oropharynx as per pre-operative assessment  ? ? ? ? ?

## 2021-06-30 NOTE — Op Note (Signed)
06/30/2021 ? ?11:21 AM ? ?PATIENT:  Renee Mccormick  46 y.o. female ? ?PRE-OPERATIVE DIAGNOSIS:  Fibroids ?Dysmenorrhea ? ?POST-OPERATIVE DIAGNOSIS:  Fibroids ?Dysmenorrhea ? ?PROCEDURE:  Procedure(s): ?XI ROBOTIC ASSISTED LAPAROSCOPIC HYSTERECTOMY AND BILATERAL SALPINGECTOMY, VAGINAL BIOPSY (Bilateral) ? ?SURGEON:  Surgeon(s) and Role: ?   Christophe Louis, MD - Primary ? ?PHYSICIAN ASSISTANT:  ? ?ASSISTANTS: Gaylord Shih RNFA assisted due to complexity of the anatomy   ? ?ANESTHESIA:   general ? ?EBL:  50 cc   ? ?BLOOD ADMINISTERED:none ? ?DRAINS: Urinary Catheter (Foley)  ? ?LOCAL MEDICATIONS USED:  OTHER ropivicaine ? ?SPECIMEN:  Source of Specimen:  uterus cervix and bilateral fallopian tubes  .Marland Kitchen Biopsy of the right vaginal side wall  ? ?DISPOSITION OF SPECIMEN:  PATHOLOGY ? ?COUNTS:  YES ? ?TOURNIQUET:  * No tourniquets in log * ? ?DICTATION: .Note written in EPIC ? ?PLAN OF CARE: Admit for overnight observation ? ?PATIENT DISPOSITION:  PACU - hemodynamically stable. ?  ?Delay start of Pharmacological VTE agent (>24hrs) due to surgical blood loss or risk of bleeding: not applicable ? ?Findings: Normal external genitalia...hyperpigmented lesion on the upper Right vaginal side wall ,  normal appearing cervix. Enlarged fibroid uterus hydrosalpinx of the right fallopian tube... normal appearing left fallopian tubes and ovaries bilaterally.  ? ? ?Procedure: The patient was taken to operating room #[facility] at [facility] where she was placed under general anesthesia.Time out was performed. Marland Kitchen She was placed in dorsal lithotomy position and prepped and draped in the usual sterile fashion. A weighted speculum was placed into the vagina. A Deaver was placed anteriorly for retraction. The anterior lip of the cervix was grasped with a single-tooth tenaculum. The vaginal mucosa was injected with 2.5 cc of ropivacaine at the 2/4/ 8 and 10 o'clock positions. The uterus was sounded to 6 cm. the cervix was dilated to 6 mm . 0 vicryl suture  placed at the 12 and 6:00 positions Of the cervix to facilitate placement of a Ru mi uterine manipulator.  Prior to placement of the manipulator a biopsy of the hyperpigmented lesion on the right vaginal side wall was obtained using Tischler. The manipulator was placed without difficulty. Weighted speculum and Deaver were removed . Foley catheter was placed.  ? ?Attention was turned to the patient's abdomen where a 8 mm trocar was placed at  the umbilicus under direct visualization . The pneumoperitoneum was achieved with PCO2 gas. The laparoscope was removed. 60 cc of ropivacaine were injected into the abdominal cavity. The laparoscope was reinserted. An 8 mm trocar was placed in the right upper quadrant 16 centimeters from the umbilicus.later connected to robotic arm #3).  An 8 mm incision was made in the left upper quadrant 16 cm from the umbilicus and connected to robot arm #1. Marland Kitchen Attention was turned to the left upper quadrant where a 8 mm midclavicular assistant trocar was placed. ( All incision sites were injected with 10cc of ropivacaine prior to port placement. )  ? ?Once all ports had been placed under direct visualization.The laparoscope was removed and the [place] robotic system was thin right-sided docked. The robotic arms were connected to the corresponding trocars as listed above. The laparoscope was then reinserted. The long tip bipolar forceps were placed into port #1.  A vessel sealer ( alternating with scissors) was placed in port #3. All instruments were directed into the pelvis under direct visualization.  ? ?Attention was turned to the surgeons console.. The left mesosalpinx and left utero-ovarian ligament  was cauterized and transected with the vessel sealer The broad ligament was cauterized and transected with the vessel sealer .The round ligament was cauterized and transected with the vessel sealer  The anterior leaf of broad ligament was incised along the bladder reflection to the midline.   ?The right  mesosalpinx and right utero-ovarian ligament was cauterized and transected with the vessel sealer. The right broad ligament was cauterized and transected with the vessel sealer. The right round ligament was cauterized and transected with the vessel sealer The broad ligament was incised to the midline. The bladder was dissected off the lower uterine segments of the cervix via sharp and blunt dissection.  ? ?The uterine arteries were skeleton bilaterally. They were  cauterized and transected with the vessel sealer The KOH ring was identified. The anterior colpotomy was performed followed by the posterior colpotomy. Once the uterus,cervix and bilateral fallopian tubes were completely excised was removed through the vagina. ?The  bipolar forceps and scissors were removed and cobra forceps were placed in the port #1 and the mega needle driver was placed in to port #3.  ?The vaginal cuff was closed with running suture if 0 v-lock. The pelvis was irrigated. Marland KitchenMarland KitchenMarland KitchenExcellent hemostasis was noted. Pt was noted to have heme in the foley bag. The bladder was then filled in  a retrograde fashion with sterile water stained with methylene blue... no bladder defect was detected.. The bladder was drained.  Arista was placed along the vaginal cuff.  All pelvic pedicles were examined and hemostasis was noted.  ?All instruments removed from the ports. All ports were removed under direct Visualization. The pneumoperitoneum was released. The skin incisions were closed with 4-0 Vicryl and then covered with Derma bond.  ? ?  ?Sponge lap and needle counts weIre correct x 2. The patient was awakened from anesthesia and taken to the recovery room in stable condition.   ? ? ?

## 2021-06-30 NOTE — H&P (Signed)
Date of Initial H&P: 06/29/2021 ? ?History reviewed, patient examined, no change in status, stable for surgery.  ?

## 2021-06-30 NOTE — Anesthesia Postprocedure Evaluation (Signed)
Anesthesia Post Note ? ?Patient: Renee Mccormick ? ?Procedure(s) Performed: XI ROBOTIC ASSISTED LAPAROSCOPIC HYSTERECTOMY AND BILATERAL SALPINGECTOMY, VAGINAL BIOPSY (Bilateral) ? ?  ? ?Patient location during evaluation: PACU ?Anesthesia Type: General ?Level of consciousness: sedated ?Pain management: pain level controlled ?Vital Signs Assessment: post-procedure vital signs reviewed and stable ?Respiratory status: spontaneous breathing and respiratory function stable ?Cardiovascular status: stable ?Postop Assessment: no apparent nausea or vomiting ?Anesthetic complications: no ? ? ?No notable events documented. ? ?Last Vitals:  ?Vitals:  ? 06/30/21 1211 06/30/21 1217  ?BP:  (!) 153/99  ?Pulse: 89 83  ?Resp: 20 17  ?Temp:    ?SpO2: 97% 95%  ?  ?Last Pain:  ?Vitals:  ? 06/30/21 1118  ?TempSrc:   ?PainSc: Asleep  ? ? ?  ?  ?  ?  ?  ?  ? ?Brittan Butterbaugh DANIEL ? ? ? ? ?

## 2021-06-30 NOTE — Discharge Summary (Signed)
Physician Discharge Summary  ?Patient ID: ?Renee Mccormick ?MRN: [id number] ?DOB/AGE: Jan 20, 1976 46 y.o. ? ?Admit date: 06/30/2021 ?Discharge date: 06/30/2021 ? ?Admission Diagnoses:Fibroids Intramural / dysmenorrhea  ? ?Discharge Diagnoses:  ?Principal Problem: ?  Fibroids, intramural ?Active Problems: ?  S/P laparoscopic hysterectomy ? ? ?Discharged Condition: stable ? ?Hospital Course: pt was admitted for observation after undergoing a robotic assisted laparoscopic hysterectomy with bilateral salpingectomy on 06/30/2021. She is discharge home on pod #0 in stable condition. With return of bladder function. She is tolerating po and ambulating.  ? ?Consults: None ? ?Significant Diagnostic Studies: None ? ?Treatments: surgery: robotic assisted laparoscopic hysterectomy with bilateral salpingectomy  ? ?Discharge Exam: ?Blood pressure (!) 143/90, pulse 95, temperature 97.7 ?F (36.5 ?C), resp. rate 16, height '5\' 5"'$  (1.651 m), weight 88.9 kg, last menstrual period 12/23/2019, SpO2 100 %. ?General appearance: alert, cooperative, and no distress ?Resp: no respiratory distress  ?GI: soft appropriately tender nondistended  ?Extremities: extremities normal, atraumatic, no cyanosis or edema ?Incision/Wound: well approximated no erythema or exudate  ? ?Disposition: Discharge disposition: 01-Home or Self Care ? ? ? ? ? ? ?Discharge Instructions   ? ? Call MD for:  persistant nausea and vomiting   Complete by: As directed ?  ? Call MD for:  redness, tenderness, or signs of infection (pain, swelling, redness, odor or green/yellow discharge around incision site)   Complete by: As directed ?  ? Call MD for:  severe uncontrolled pain   Complete by: As directed ?  ? Call MD for:  temperature >100.4   Complete by: As directed ?  ? Diet - low sodium heart healthy   Complete by: As directed ?  ? Driving Restrictions   Complete by: As directed ?  ? Avoid driving for 1 week  ? Increase activity slowly   Complete by: As directed ?  ? Lifting  restrictions   Complete by: As directed ?  ? Avoid lifting over 10 lbs  ? No wound care   Complete by: As directed ?  ? Sexual Activity Restrictions   Complete by: As directed ?  ? Avoid sex for at least 6 weeks and until approved by Dr. Landry Mellow  ? ?  ? ?Allergies as of 06/30/2021   ? ?   Reactions  ? Tape Dermatitis  ? Paper tape is okay.  ? Tramadol Hives  ? ?  ? ?  ?Medication List  ?  ? ?TAKE these medications   ? ?acetaminophen 500 MG tablet ?Commonly known as: TYLENOL ?Take 2 tablets (1,000 mg total) by mouth every 8 (eight) hours as needed. ?What changed:  ?how much to take ?when to take this ?  ?cephALEXin 500 MG capsule ?Commonly known as: KEFLEX ?Take 500 mg by mouth 4 (four) times daily. 06/19/21 - 06/26/21 ?  ?cetirizine 10 MG tablet ?Commonly known as: ZYRTEC ?Take 10 mg by mouth as needed. ?  ?cyclobenzaprine 10 MG tablet ?Commonly known as: FLEXERIL ?Take 10 mg by mouth 3 (three) times daily as needed for muscle spasms. ?  ?DICLOFENAC PO ?Take 50 mg by mouth as needed. Diclofenac EC ?  ?ergocalciferol 1.25 MG (50000 UT) capsule ?Commonly known as: VITAMIN D2 ?Take 50,000 Units by mouth once a week. ?  ?ibuprofen 600 MG tablet ?Commonly known as: ADVIL ?Take 1 tablet (600 mg total) by mouth every 6 (six) hours as needed. ?  ?metroNIDAZOLE 500 MG tablet ?Commonly known as: FLAGYL ?Take 500 mg by mouth 2 (two) times daily. 06/19/21 - 06/26/21 ?  ?  nitrofurantoin (macrocrystal-monohydrate) 100 MG capsule ?Commonly known as: MACROBID ?Take 100 mg by mouth 2 (two) times daily. ?  ?oxyCODONE 5 MG immediate release tablet ?Commonly known as: Oxy IR/ROXICODONE ?Take 5 mg by mouth 3 (three) times daily as needed for severe pain. ?What changed: Another medication with the same name was added. Make sure you understand how and when to take each. ?  ?oxyCODONE 5 MG immediate release tablet ?Commonly known as: Oxy IR/ROXICODONE ?Take 1-2 tablets (5-10 mg total) by mouth every 4 (four) hours as needed for moderate pain. ?What  changed: You were already taking a medication with the same name, and this prescription was added. Make sure you understand how and when to take each. ?  ?SUMAtriptan 100 MG tablet ?Commonly known as: IMITREX ?Take 100 mg by mouth daily as needed for migraine. May repeat in 2 hours if headache persists or recurs. ?  ?triamcinolone 0.1 % cream : eucerin Crea ?Apply 1 application. topically 2 (two) times daily. For eczema ?  ? ?  ? ? Follow-up Information   ? ? Christophe Louis, MD. Go in 2 week(s).   ?Specialty: Obstetrics and Gynecology ?Contact information: ?[address] ?[address] ?[place] [address] ?[phone] ? ? ?  ?  ? ?  ?  ? ?  ? ? ?Signed: ?Christophe Louis ?06/30/2021, 5:48 PM ? ? ?

## 2021-07-01 ENCOUNTER — Encounter (HOSPITAL_BASED_OUTPATIENT_CLINIC_OR_DEPARTMENT_OTHER): Payer: Self-pay | Admitting: Obstetrics and Gynecology

## 2021-07-02 LAB — SURGICAL PATHOLOGY

## 2021-07-13 ENCOUNTER — Ambulatory Visit
Admission: RE | Admit: 2021-07-13 | Discharge: 2021-07-13 | Disposition: A | Discharge status: Home / Self Care | Payer: Self-pay | Source: Ambulatory Visit | Attending: Obstetrics and Gynecology | Admitting: Obstetrics and Gynecology

## 2021-07-13 DIAGNOSIS — Z1231 Encounter for screening mammogram for malignant neoplasm of breast: Secondary | ICD-10-CM | POA: Diagnosis not present

## 2021-08-09 DIAGNOSIS — N898 Other specified noninflammatory disorders of vagina: Secondary | ICD-10-CM | POA: Diagnosis not present

## 2021-12-14 DIAGNOSIS — E7800 Pure hypercholesterolemia, unspecified: Secondary | ICD-10-CM | POA: Diagnosis not present

## 2021-12-14 DIAGNOSIS — E669 Obesity, unspecified: Secondary | ICD-10-CM | POA: Diagnosis not present

## 2021-12-14 DIAGNOSIS — Z23 Encounter for immunization: Secondary | ICD-10-CM | POA: Diagnosis not present

## 2021-12-14 DIAGNOSIS — H6122 Impacted cerumen, left ear: Secondary | ICD-10-CM | POA: Diagnosis not present

## 2021-12-14 DIAGNOSIS — G43009 Migraine without aura, not intractable, without status migrainosus: Secondary | ICD-10-CM | POA: Diagnosis not present

## 2022-01-03 DIAGNOSIS — E669 Obesity, unspecified: Secondary | ICD-10-CM | POA: Diagnosis not present

## 2022-01-03 DIAGNOSIS — Z0000 Encounter for general adult medical examination without abnormal findings: Secondary | ICD-10-CM | POA: Diagnosis not present

## 2022-01-03 DIAGNOSIS — E7800 Pure hypercholesterolemia, unspecified: Secondary | ICD-10-CM | POA: Diagnosis not present

## 2022-01-03 DIAGNOSIS — E559 Vitamin D deficiency, unspecified: Secondary | ICD-10-CM | POA: Diagnosis not present

## 2022-01-03 DIAGNOSIS — G43009 Migraine without aura, not intractable, without status migrainosus: Secondary | ICD-10-CM | POA: Diagnosis not present

## 2022-01-24 DIAGNOSIS — J019 Acute sinusitis, unspecified: Secondary | ICD-10-CM | POA: Diagnosis not present

## 2022-02-09 DIAGNOSIS — R030 Elevated blood-pressure reading, without diagnosis of hypertension: Secondary | ICD-10-CM | POA: Diagnosis not present

## 2022-02-09 DIAGNOSIS — M545 Low back pain, unspecified: Secondary | ICD-10-CM | POA: Diagnosis not present

## 2022-02-28 DIAGNOSIS — Z01419 Encounter for gynecological examination (general) (routine) without abnormal findings: Secondary | ICD-10-CM | POA: Diagnosis not present

## 2022-03-30 DIAGNOSIS — R2231 Localized swelling, mass and lump, right upper limb: Secondary | ICD-10-CM | POA: Diagnosis not present

## 2022-03-30 DIAGNOSIS — R229 Localized swelling, mass and lump, unspecified: Secondary | ICD-10-CM | POA: Diagnosis not present

## 2022-04-26 DIAGNOSIS — D241 Benign neoplasm of right breast: Secondary | ICD-10-CM | POA: Diagnosis not present

## 2022-04-26 DIAGNOSIS — R229 Localized swelling, mass and lump, unspecified: Secondary | ICD-10-CM | POA: Diagnosis not present

## 2022-07-11 DIAGNOSIS — S62644A Nondisplaced fracture of proximal phalanx of right ring finger, initial encounter for closed fracture: Secondary | ICD-10-CM | POA: Diagnosis not present

## 2022-07-14 DIAGNOSIS — S62619A Displaced fracture of proximal phalanx of unspecified finger, initial encounter for closed fracture: Secondary | ICD-10-CM | POA: Diagnosis not present

## 2022-07-22 DIAGNOSIS — Z01810 Encounter for preprocedural cardiovascular examination: Secondary | ICD-10-CM | POA: Diagnosis not present

## 2022-07-22 DIAGNOSIS — S62619A Displaced fracture of proximal phalanx of unspecified finger, initial encounter for closed fracture: Secondary | ICD-10-CM | POA: Diagnosis not present

## 2022-07-28 DIAGNOSIS — S62614A Displaced fracture of proximal phalanx of right ring finger, initial encounter for closed fracture: Secondary | ICD-10-CM | POA: Diagnosis not present

## 2022-08-02 DIAGNOSIS — M25641 Stiffness of right hand, not elsewhere classified: Secondary | ICD-10-CM | POA: Diagnosis not present

## 2022-08-02 DIAGNOSIS — R29898 Other symptoms and signs involving the musculoskeletal system: Secondary | ICD-10-CM | POA: Diagnosis not present

## 2022-08-02 DIAGNOSIS — S62614D Displaced fracture of proximal phalanx of right ring finger, subsequent encounter for fracture with routine healing: Secondary | ICD-10-CM | POA: Diagnosis not present

## 2022-08-08 DIAGNOSIS — S62614D Displaced fracture of proximal phalanx of right ring finger, subsequent encounter for fracture with routine healing: Secondary | ICD-10-CM | POA: Diagnosis not present

## 2022-08-15 DIAGNOSIS — S62614D Displaced fracture of proximal phalanx of right ring finger, subsequent encounter for fracture with routine healing: Secondary | ICD-10-CM | POA: Diagnosis not present

## 2022-08-15 DIAGNOSIS — M25641 Stiffness of right hand, not elsewhere classified: Secondary | ICD-10-CM | POA: Diagnosis not present

## 2022-08-15 DIAGNOSIS — R29898 Other symptoms and signs involving the musculoskeletal system: Secondary | ICD-10-CM | POA: Diagnosis not present

## 2022-08-19 DIAGNOSIS — S62614D Displaced fracture of proximal phalanx of right ring finger, subsequent encounter for fracture with routine healing: Secondary | ICD-10-CM | POA: Diagnosis not present

## 2022-08-19 DIAGNOSIS — M25641 Stiffness of right hand, not elsewhere classified: Secondary | ICD-10-CM | POA: Diagnosis not present

## 2022-08-19 DIAGNOSIS — R29898 Other symptoms and signs involving the musculoskeletal system: Secondary | ICD-10-CM | POA: Diagnosis not present

## 2022-08-22 DIAGNOSIS — Z6837 Body mass index (BMI) 37.0-37.9, adult: Secondary | ICD-10-CM | POA: Diagnosis not present

## 2022-08-22 DIAGNOSIS — S62619D Displaced fracture of proximal phalanx of unspecified finger, subsequent encounter for fracture with routine healing: Secondary | ICD-10-CM | POA: Diagnosis not present

## 2022-08-22 DIAGNOSIS — I10 Essential (primary) hypertension: Secondary | ICD-10-CM | POA: Diagnosis not present

## 2022-08-22 DIAGNOSIS — M25641 Stiffness of right hand, not elsewhere classified: Secondary | ICD-10-CM | POA: Diagnosis not present

## 2022-08-22 DIAGNOSIS — E669 Obesity, unspecified: Secondary | ICD-10-CM | POA: Diagnosis not present

## 2022-08-24 DIAGNOSIS — M25641 Stiffness of right hand, not elsewhere classified: Secondary | ICD-10-CM | POA: Diagnosis not present

## 2022-08-24 DIAGNOSIS — S62619D Displaced fracture of proximal phalanx of unspecified finger, subsequent encounter for fracture with routine healing: Secondary | ICD-10-CM | POA: Diagnosis not present

## 2022-08-30 DIAGNOSIS — M25641 Stiffness of right hand, not elsewhere classified: Secondary | ICD-10-CM | POA: Diagnosis not present

## 2022-08-30 DIAGNOSIS — S62619D Displaced fracture of proximal phalanx of unspecified finger, subsequent encounter for fracture with routine healing: Secondary | ICD-10-CM | POA: Diagnosis not present

## 2022-09-01 DIAGNOSIS — M25641 Stiffness of right hand, not elsewhere classified: Secondary | ICD-10-CM | POA: Diagnosis not present

## 2022-09-01 DIAGNOSIS — S62619D Displaced fracture of proximal phalanx of unspecified finger, subsequent encounter for fracture with routine healing: Secondary | ICD-10-CM | POA: Diagnosis not present

## 2022-09-05 DIAGNOSIS — S62619D Displaced fracture of proximal phalanx of unspecified finger, subsequent encounter for fracture with routine healing: Secondary | ICD-10-CM | POA: Diagnosis not present

## 2022-09-05 DIAGNOSIS — M25641 Stiffness of right hand, not elsewhere classified: Secondary | ICD-10-CM | POA: Diagnosis not present

## 2022-09-07 DIAGNOSIS — M25641 Stiffness of right hand, not elsewhere classified: Secondary | ICD-10-CM | POA: Diagnosis not present

## 2022-09-07 DIAGNOSIS — S62619D Displaced fracture of proximal phalanx of unspecified finger, subsequent encounter for fracture with routine healing: Secondary | ICD-10-CM | POA: Diagnosis not present

## 2022-09-12 DIAGNOSIS — M25641 Stiffness of right hand, not elsewhere classified: Secondary | ICD-10-CM | POA: Diagnosis not present

## 2022-09-12 DIAGNOSIS — S62619D Displaced fracture of proximal phalanx of unspecified finger, subsequent encounter for fracture with routine healing: Secondary | ICD-10-CM | POA: Diagnosis not present

## 2022-09-15 DIAGNOSIS — S62619D Displaced fracture of proximal phalanx of unspecified finger, subsequent encounter for fracture with routine healing: Secondary | ICD-10-CM | POA: Diagnosis not present

## 2022-09-15 DIAGNOSIS — M25641 Stiffness of right hand, not elsewhere classified: Secondary | ICD-10-CM | POA: Diagnosis not present

## 2022-09-21 DIAGNOSIS — M25641 Stiffness of right hand, not elsewhere classified: Secondary | ICD-10-CM | POA: Diagnosis not present

## 2022-09-21 DIAGNOSIS — S62619D Displaced fracture of proximal phalanx of unspecified finger, subsequent encounter for fracture with routine healing: Secondary | ICD-10-CM | POA: Diagnosis not present

## 2022-09-23 DIAGNOSIS — S62619A Displaced fracture of proximal phalanx of unspecified finger, initial encounter for closed fracture: Secondary | ICD-10-CM | POA: Diagnosis not present

## 2022-09-23 DIAGNOSIS — S62614D Displaced fracture of proximal phalanx of right ring finger, subsequent encounter for fracture with routine healing: Secondary | ICD-10-CM | POA: Diagnosis not present

## 2022-09-23 DIAGNOSIS — R29898 Other symptoms and signs involving the musculoskeletal system: Secondary | ICD-10-CM | POA: Diagnosis not present

## 2022-09-23 DIAGNOSIS — M25641 Stiffness of right hand, not elsewhere classified: Secondary | ICD-10-CM | POA: Diagnosis not present

## 2022-09-26 DIAGNOSIS — M25641 Stiffness of right hand, not elsewhere classified: Secondary | ICD-10-CM | POA: Diagnosis not present

## 2022-09-26 DIAGNOSIS — S62614D Displaced fracture of proximal phalanx of right ring finger, subsequent encounter for fracture with routine healing: Secondary | ICD-10-CM | POA: Diagnosis not present

## 2022-09-26 DIAGNOSIS — R29898 Other symptoms and signs involving the musculoskeletal system: Secondary | ICD-10-CM | POA: Diagnosis not present

## 2022-09-26 DIAGNOSIS — S62619A Displaced fracture of proximal phalanx of unspecified finger, initial encounter for closed fracture: Secondary | ICD-10-CM | POA: Diagnosis not present

## 2022-09-30 DIAGNOSIS — S62614D Displaced fracture of proximal phalanx of right ring finger, subsequent encounter for fracture with routine healing: Secondary | ICD-10-CM | POA: Diagnosis not present

## 2022-09-30 DIAGNOSIS — S62619A Displaced fracture of proximal phalanx of unspecified finger, initial encounter for closed fracture: Secondary | ICD-10-CM | POA: Diagnosis not present

## 2022-09-30 DIAGNOSIS — M25641 Stiffness of right hand, not elsewhere classified: Secondary | ICD-10-CM | POA: Diagnosis not present

## 2022-09-30 DIAGNOSIS — R29898 Other symptoms and signs involving the musculoskeletal system: Secondary | ICD-10-CM | POA: Diagnosis not present

## 2022-10-06 DIAGNOSIS — R29898 Other symptoms and signs involving the musculoskeletal system: Secondary | ICD-10-CM | POA: Diagnosis not present

## 2022-10-06 DIAGNOSIS — S62619A Displaced fracture of proximal phalanx of unspecified finger, initial encounter for closed fracture: Secondary | ICD-10-CM | POA: Diagnosis not present

## 2022-10-06 DIAGNOSIS — S62614D Displaced fracture of proximal phalanx of right ring finger, subsequent encounter for fracture with routine healing: Secondary | ICD-10-CM | POA: Diagnosis not present

## 2022-10-06 DIAGNOSIS — M25641 Stiffness of right hand, not elsewhere classified: Secondary | ICD-10-CM | POA: Diagnosis not present

## 2022-10-11 DIAGNOSIS — S62619A Displaced fracture of proximal phalanx of unspecified finger, initial encounter for closed fracture: Secondary | ICD-10-CM | POA: Diagnosis not present

## 2022-10-11 DIAGNOSIS — M25641 Stiffness of right hand, not elsewhere classified: Secondary | ICD-10-CM | POA: Diagnosis not present

## 2022-10-11 DIAGNOSIS — S62614D Displaced fracture of proximal phalanx of right ring finger, subsequent encounter for fracture with routine healing: Secondary | ICD-10-CM | POA: Diagnosis not present

## 2022-10-11 DIAGNOSIS — R29898 Other symptoms and signs involving the musculoskeletal system: Secondary | ICD-10-CM | POA: Diagnosis not present

## 2022-10-13 DIAGNOSIS — S62619A Displaced fracture of proximal phalanx of unspecified finger, initial encounter for closed fracture: Secondary | ICD-10-CM | POA: Diagnosis not present

## 2022-10-13 DIAGNOSIS — M25641 Stiffness of right hand, not elsewhere classified: Secondary | ICD-10-CM | POA: Diagnosis not present

## 2022-10-13 DIAGNOSIS — R29898 Other symptoms and signs involving the musculoskeletal system: Secondary | ICD-10-CM | POA: Diagnosis not present

## 2022-10-13 DIAGNOSIS — S62614D Displaced fracture of proximal phalanx of right ring finger, subsequent encounter for fracture with routine healing: Secondary | ICD-10-CM | POA: Diagnosis not present

## 2022-10-20 DIAGNOSIS — S62614D Displaced fracture of proximal phalanx of right ring finger, subsequent encounter for fracture with routine healing: Secondary | ICD-10-CM | POA: Diagnosis not present

## 2022-10-20 DIAGNOSIS — R29898 Other symptoms and signs involving the musculoskeletal system: Secondary | ICD-10-CM | POA: Diagnosis not present

## 2022-10-20 DIAGNOSIS — S62619A Displaced fracture of proximal phalanx of unspecified finger, initial encounter for closed fracture: Secondary | ICD-10-CM | POA: Diagnosis not present

## 2022-10-20 DIAGNOSIS — M25641 Stiffness of right hand, not elsewhere classified: Secondary | ICD-10-CM | POA: Diagnosis not present

## 2022-11-01 DIAGNOSIS — M25641 Stiffness of right hand, not elsewhere classified: Secondary | ICD-10-CM | POA: Diagnosis not present

## 2022-11-01 DIAGNOSIS — S62619A Displaced fracture of proximal phalanx of unspecified finger, initial encounter for closed fracture: Secondary | ICD-10-CM | POA: Diagnosis not present

## 2022-11-01 DIAGNOSIS — R29898 Other symptoms and signs involving the musculoskeletal system: Secondary | ICD-10-CM | POA: Diagnosis not present

## 2022-11-01 DIAGNOSIS — S62614D Displaced fracture of proximal phalanx of right ring finger, subsequent encounter for fracture with routine healing: Secondary | ICD-10-CM | POA: Diagnosis not present

## 2022-11-07 DIAGNOSIS — S62614D Displaced fracture of proximal phalanx of right ring finger, subsequent encounter for fracture with routine healing: Secondary | ICD-10-CM | POA: Diagnosis not present

## 2022-11-07 DIAGNOSIS — S62619A Displaced fracture of proximal phalanx of unspecified finger, initial encounter for closed fracture: Secondary | ICD-10-CM | POA: Diagnosis not present

## 2022-11-07 DIAGNOSIS — M25641 Stiffness of right hand, not elsewhere classified: Secondary | ICD-10-CM | POA: Diagnosis not present

## 2022-11-07 DIAGNOSIS — R29898 Other symptoms and signs involving the musculoskeletal system: Secondary | ICD-10-CM | POA: Diagnosis not present

## 2022-11-14 DIAGNOSIS — M25649 Stiffness of unspecified hand, not elsewhere classified: Secondary | ICD-10-CM | POA: Diagnosis not present

## 2023-02-09 DIAGNOSIS — Z0000 Encounter for general adult medical examination without abnormal findings: Secondary | ICD-10-CM | POA: Diagnosis not present

## 2023-02-09 DIAGNOSIS — G43909 Migraine, unspecified, not intractable, without status migrainosus: Secondary | ICD-10-CM | POA: Diagnosis not present

## 2023-02-09 DIAGNOSIS — Z23 Encounter for immunization: Secondary | ICD-10-CM | POA: Diagnosis not present

## 2023-02-09 DIAGNOSIS — E7800 Pure hypercholesterolemia, unspecified: Secondary | ICD-10-CM | POA: Diagnosis not present

## 2023-02-09 DIAGNOSIS — I10 Essential (primary) hypertension: Secondary | ICD-10-CM | POA: Diagnosis not present

## 2023-02-09 DIAGNOSIS — E559 Vitamin D deficiency, unspecified: Secondary | ICD-10-CM | POA: Diagnosis not present

## 2023-02-18 DIAGNOSIS — I10 Essential (primary) hypertension: Secondary | ICD-10-CM | POA: Diagnosis not present

## 2023-02-18 DIAGNOSIS — R0602 Shortness of breath: Secondary | ICD-10-CM | POA: Diagnosis not present

## 2023-02-18 DIAGNOSIS — R079 Chest pain, unspecified: Secondary | ICD-10-CM | POA: Diagnosis not present

## 2023-02-18 DIAGNOSIS — R5383 Other fatigue: Secondary | ICD-10-CM | POA: Diagnosis not present

## 2023-02-18 DIAGNOSIS — R0789 Other chest pain: Secondary | ICD-10-CM | POA: Diagnosis not present

## 2023-02-27 ENCOUNTER — Other Ambulatory Visit: Payer: Self-pay | Admitting: Obstetrics and Gynecology

## 2023-02-27 DIAGNOSIS — Z1231 Encounter for screening mammogram for malignant neoplasm of breast: Secondary | ICD-10-CM

## 2023-03-13 DIAGNOSIS — R0683 Snoring: Secondary | ICD-10-CM | POA: Diagnosis not present

## 2023-03-13 DIAGNOSIS — I10 Essential (primary) hypertension: Secondary | ICD-10-CM | POA: Diagnosis not present

## 2023-03-13 DIAGNOSIS — G43909 Migraine, unspecified, not intractable, without status migrainosus: Secondary | ICD-10-CM | POA: Diagnosis not present

## 2023-03-13 DIAGNOSIS — E7800 Pure hypercholesterolemia, unspecified: Secondary | ICD-10-CM | POA: Diagnosis not present

## 2023-03-17 IMAGING — MG MM DIGITAL SCREENING BILAT W/ TOMO AND CAD
8 series · 8 of 24 positions shown · non-contrast
Comparison: Previous exam(s).

CLINICAL DATA: Screening.

EXAM:
DIGITAL SCREENING BILATERAL MAMMOGRAM WITH TOMOSYNTHESIS AND CAD
TECHNIQUE: Bilateral screening digital craniocaudal and mediolateral oblique
mammograms were obtained. Bilateral screening digital breast
tomosynthesis was performed. The images were evaluated with
computer-aided detection.

[L MLO synth-2D]
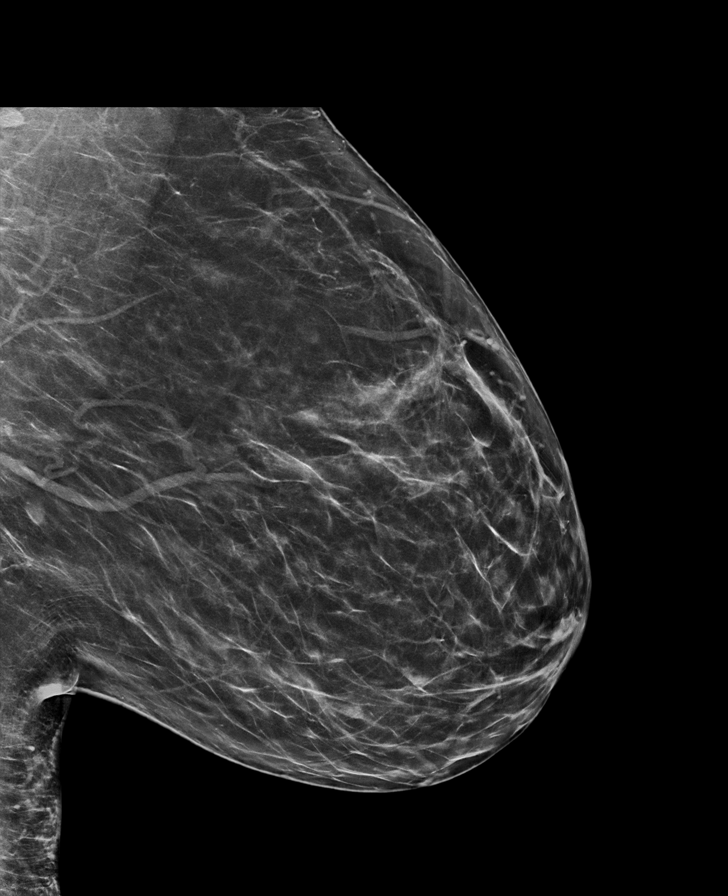

[L CC synth-2D]
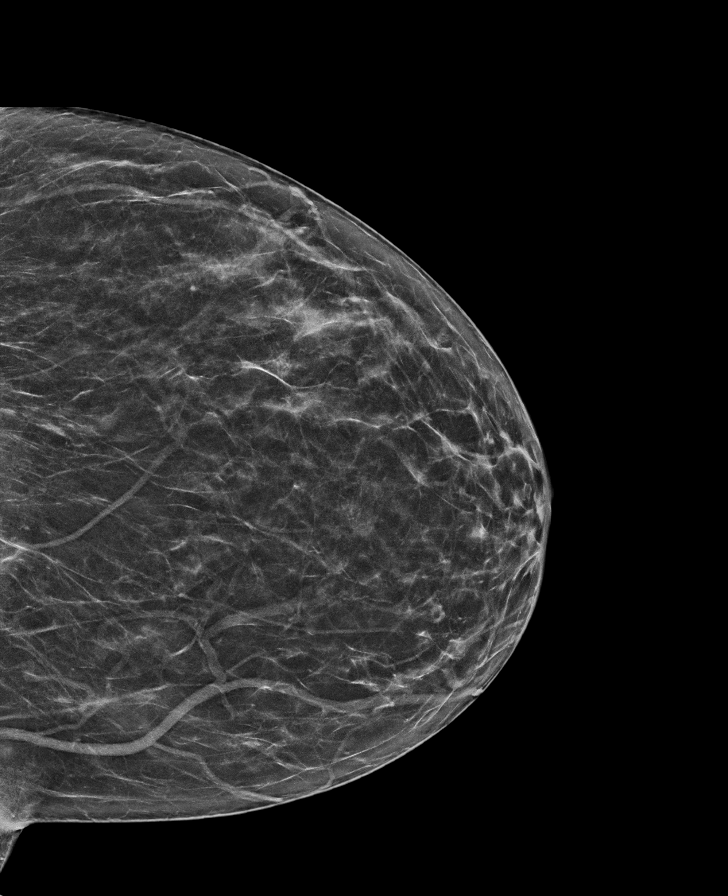

[R CC synth-2D]
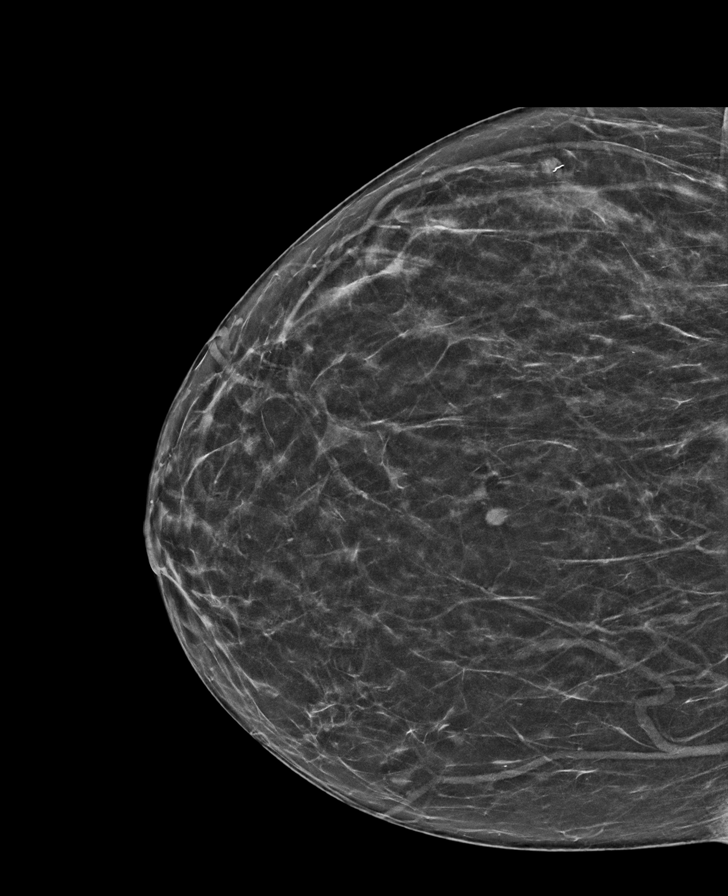

[R MLO synth-2D]
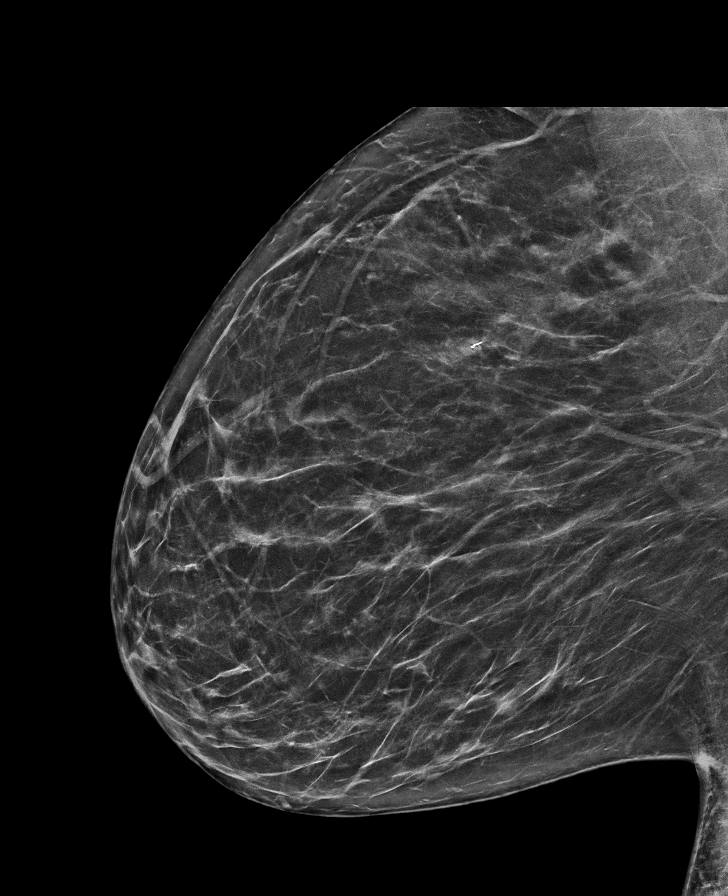

[L MLO tomo · tomo slice 37/74.0]
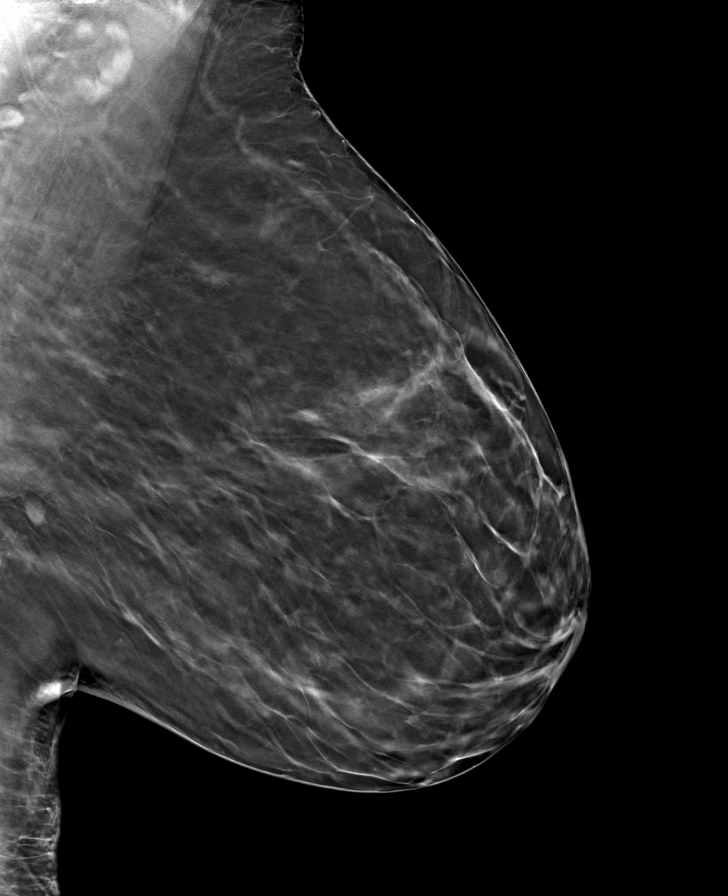

[L CC tomo · tomo slice 33/64.0]
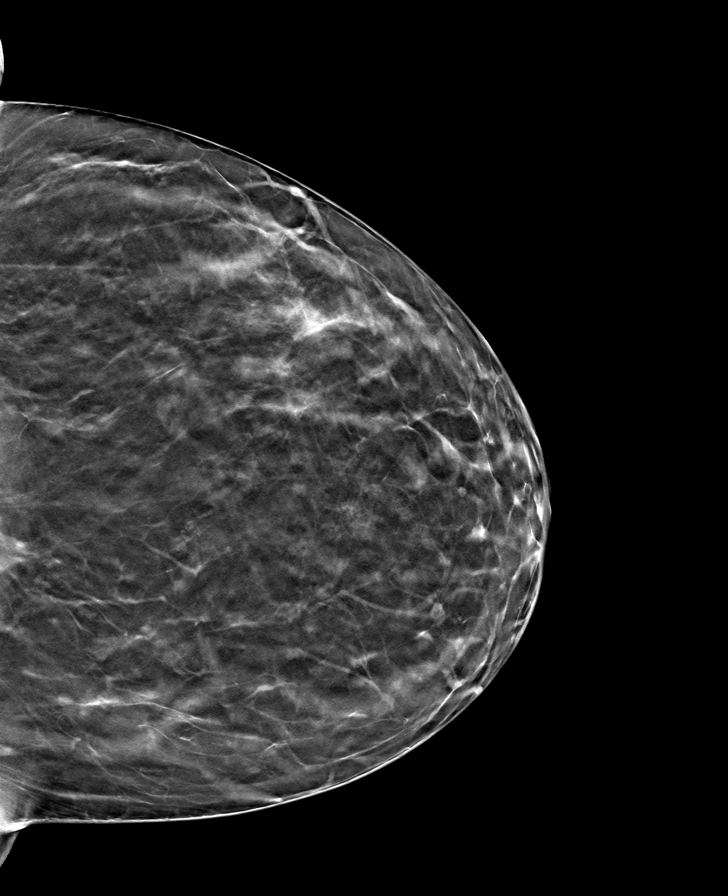

[R CC tomo · tomo slice 32/63.0]
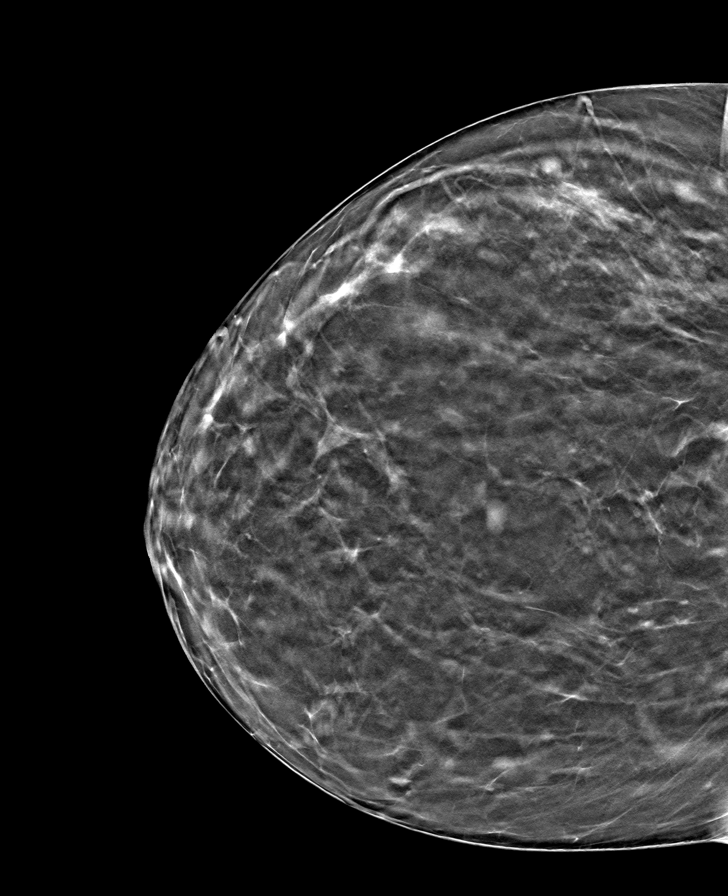

[R MLO tomo · tomo slice 36/71.0]
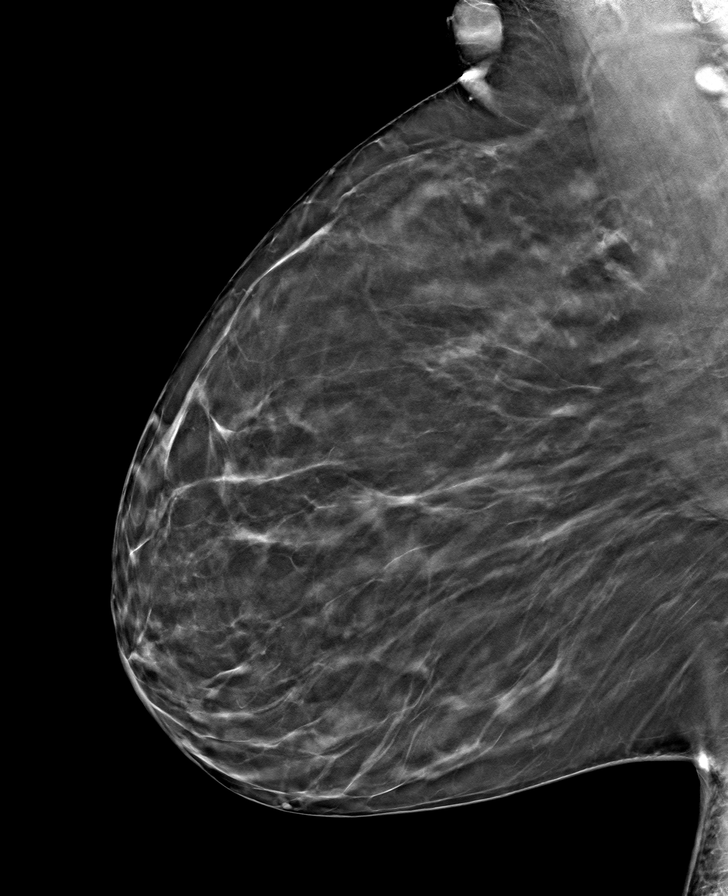

[8 of 24 positions shown; findings below may reference images not displayed]

ACR Breast Density Category b: There are scattered areas of
fibroglandular density.
FINDINGS: There are no findings suspicious for malignancy.
IMPRESSION: No mammographic evidence of malignancy. A result letter of this
screening mammogram will be mailed directly to the patient.

RECOMMENDATION:
Screening mammogram in one year. (Code:51-O-LD2)

BI-RADS CATEGORY  1: Negative.

## 2023-03-28 DIAGNOSIS — G4710 Hypersomnia, unspecified: Secondary | ICD-10-CM | POA: Diagnosis not present

## 2023-04-24 ENCOUNTER — Ambulatory Visit: Payer: BC Managed Care – PPO

## 2023-04-26 ENCOUNTER — Ambulatory Visit: Payer: BC Managed Care – PPO

## 2023-06-26 ENCOUNTER — Ambulatory Visit
Admission: RE | Admit: 2023-06-26 | Discharge: 2023-06-26 | Disposition: A | Discharge status: Home / Self Care | Payer: BC Managed Care – PPO | Source: Ambulatory Visit | Attending: Obstetrics and Gynecology | Admitting: Obstetrics and Gynecology

## 2023-06-26 DIAGNOSIS — Z1231 Encounter for screening mammogram for malignant neoplasm of breast: Secondary | ICD-10-CM | POA: Diagnosis not present

## 2023-06-26 DIAGNOSIS — Z01419 Encounter for gynecological examination (general) (routine) without abnormal findings: Secondary | ICD-10-CM | POA: Diagnosis not present

## 2023-07-24 DIAGNOSIS — K641 Second degree hemorrhoids: Secondary | ICD-10-CM | POA: Diagnosis not present

## 2023-07-24 DIAGNOSIS — Z1211 Encounter for screening for malignant neoplasm of colon: Secondary | ICD-10-CM | POA: Diagnosis not present

## 2023-07-24 DIAGNOSIS — K922 Gastrointestinal hemorrhage, unspecified: Secondary | ICD-10-CM | POA: Diagnosis not present

## 2023-07-24 DIAGNOSIS — D123 Benign neoplasm of transverse colon: Secondary | ICD-10-CM | POA: Diagnosis not present

## 2023-09-05 DIAGNOSIS — G43909 Migraine, unspecified, not intractable, without status migrainosus: Secondary | ICD-10-CM | POA: Diagnosis not present

## 2023-09-05 DIAGNOSIS — R0683 Snoring: Secondary | ICD-10-CM | POA: Diagnosis not present

## 2023-09-05 DIAGNOSIS — E7800 Pure hypercholesterolemia, unspecified: Secondary | ICD-10-CM | POA: Diagnosis not present

## 2023-09-05 DIAGNOSIS — I10 Essential (primary) hypertension: Secondary | ICD-10-CM | POA: Diagnosis not present

## 2023-09-08 DIAGNOSIS — E669 Obesity, unspecified: Secondary | ICD-10-CM | POA: Diagnosis not present

## 2023-09-08 DIAGNOSIS — R0683 Snoring: Secondary | ICD-10-CM | POA: Diagnosis not present

## 2023-09-08 DIAGNOSIS — R5383 Other fatigue: Secondary | ICD-10-CM | POA: Diagnosis not present

## 2023-09-08 DIAGNOSIS — G4710 Hypersomnia, unspecified: Secondary | ICD-10-CM | POA: Diagnosis not present

## 2023-10-09 DIAGNOSIS — E559 Vitamin D deficiency, unspecified: Secondary | ICD-10-CM | POA: Diagnosis not present

## 2023-10-09 DIAGNOSIS — I10 Essential (primary) hypertension: Secondary | ICD-10-CM | POA: Diagnosis not present

## 2023-10-09 DIAGNOSIS — E7800 Pure hypercholesterolemia, unspecified: Secondary | ICD-10-CM | POA: Diagnosis not present

## 2023-10-09 DIAGNOSIS — G43909 Migraine, unspecified, not intractable, without status migrainosus: Secondary | ICD-10-CM | POA: Diagnosis not present

## 2023-12-28 DIAGNOSIS — M170 Bilateral primary osteoarthritis of knee: Secondary | ICD-10-CM | POA: Diagnosis not present

## 2024-01-25 DIAGNOSIS — E559 Vitamin D deficiency, unspecified: Secondary | ICD-10-CM | POA: Diagnosis not present

## 2024-01-25 DIAGNOSIS — E7800 Pure hypercholesterolemia, unspecified: Secondary | ICD-10-CM | POA: Diagnosis not present

## 2024-01-25 DIAGNOSIS — Z0000 Encounter for general adult medical examination without abnormal findings: Secondary | ICD-10-CM | POA: Diagnosis not present

## 2024-02-19 DIAGNOSIS — Z0000 Encounter for general adult medical examination without abnormal findings: Secondary | ICD-10-CM | POA: Diagnosis not present
